# Patient Record
Sex: Female | Born: 1990 | Race: Black or African American | Hispanic: No | Marital: Single | Smoking: Never smoker
Health system: Southern US, Community
[De-identification: ages and names within clinical notes are randomized; demographics above are authoritative.]

## PROBLEM LIST (undated history)

## (undated) DIAGNOSIS — K5190 Ulcerative colitis, unspecified, without complications: Secondary | ICD-10-CM

## (undated) HISTORY — DX: Ulcerative colitis, unspecified, without complications: K51.90

---

## 2018-05-12 ENCOUNTER — Encounter (HOSPITAL_COMMUNITY): Payer: Self-pay

## 2018-05-12 ENCOUNTER — Other Ambulatory Visit: Payer: Self-pay

## 2018-05-12 ENCOUNTER — Emergency Department (HOSPITAL_COMMUNITY): Payer: Managed Care, Other (non HMO)

## 2018-05-12 ENCOUNTER — Inpatient Hospital Stay (HOSPITAL_COMMUNITY)
Admission: EM | Admit: 2018-05-12 | Discharge: 2018-05-19 | DRG: 386 | Disposition: A | Discharge status: Home / Self Care | Payer: Managed Care, Other (non HMO) | Attending: Internal Medicine | Admitting: Internal Medicine

## 2018-05-12 DIAGNOSIS — D62 Acute posthemorrhagic anemia: Secondary | ICD-10-CM | POA: Diagnosis present

## 2018-05-12 DIAGNOSIS — K5190 Ulcerative colitis, unspecified, without complications: Secondary | ICD-10-CM | POA: Diagnosis present

## 2018-05-12 DIAGNOSIS — E876 Hypokalemia: Secondary | ICD-10-CM | POA: Diagnosis not present

## 2018-05-12 DIAGNOSIS — D649 Anemia, unspecified: Secondary | ICD-10-CM

## 2018-05-12 DIAGNOSIS — R000 Tachycardia, unspecified: Secondary | ICD-10-CM | POA: Diagnosis present

## 2018-05-12 DIAGNOSIS — D509 Iron deficiency anemia, unspecified: Secondary | ICD-10-CM | POA: Diagnosis present

## 2018-05-12 DIAGNOSIS — K51911 Ulcerative colitis, unspecified with rectal bleeding: Principal | ICD-10-CM | POA: Diagnosis present

## 2018-05-12 DIAGNOSIS — E871 Hypo-osmolality and hyponatremia: Secondary | ICD-10-CM | POA: Diagnosis present

## 2018-05-12 DIAGNOSIS — K529 Noninfective gastroenteritis and colitis, unspecified: Secondary | ICD-10-CM | POA: Diagnosis not present

## 2018-05-12 DIAGNOSIS — Z79899 Other long term (current) drug therapy: Secondary | ICD-10-CM

## 2018-05-12 DIAGNOSIS — K639 Disease of intestine, unspecified: Secondary | ICD-10-CM

## 2018-05-12 DIAGNOSIS — E860 Dehydration: Secondary | ICD-10-CM | POA: Diagnosis present

## 2018-05-12 DIAGNOSIS — R197 Diarrhea, unspecified: Secondary | ICD-10-CM | POA: Diagnosis present

## 2018-05-12 LAB — C DIFFICILE QUICK SCREEN W PCR REFLEX
C Diff antigen: NEGATIVE
C Diff interpretation: NOT DETECTED
C Diff toxin: NEGATIVE

## 2018-05-12 LAB — URINALYSIS, ROUTINE W REFLEX MICROSCOPIC
Bilirubin Urine: NEGATIVE
Glucose, UA: NEGATIVE mg/dL
Ketones, ur: 20 mg/dL — AB
Leukocytes, UA: NEGATIVE
Nitrite: NEGATIVE
Protein, ur: NEGATIVE mg/dL
Specific Gravity, Urine: 1.004 — ABNORMAL LOW (ref 1.005–1.030)
pH: 7 (ref 5.0–8.0)

## 2018-05-12 LAB — COMPREHENSIVE METABOLIC PANEL
ALT: 8 U/L (ref 0–44)
AST: 13 U/L — AB (ref 15–41)
Albumin: 2.4 g/dL — ABNORMAL LOW (ref 3.5–5.0)
Alkaline Phosphatase: 56 U/L (ref 38–126)
Anion gap: 13 (ref 5–15)
BUN: 9 mg/dL (ref 6–20)
CO2: 29 mmol/L (ref 22–32)
Calcium: 7.9 mg/dL — ABNORMAL LOW (ref 8.9–10.3)
Chloride: 83 mmol/L — ABNORMAL LOW (ref 98–111)
Creatinine, Ser: 0.7 mg/dL (ref 0.44–1.00)
GFR calc Af Amer: >60 mL/min (ref >60)
Glucose, Bld: 149 mg/dL — ABNORMAL HIGH (ref 70–99)
Potassium: 2.4 mmol/L — CL (ref 3.5–5.1)
Sodium: 125 mmol/L — ABNORMAL LOW (ref 135–145)
Total Bilirubin: 0.8 mg/dL (ref 0.3–1.2)
Total Protein: 6.4 g/dL — ABNORMAL LOW (ref 6.5–8.1)

## 2018-05-12 LAB — CBC WITH DIFFERENTIAL/PLATELET
Abs Immature Granulocytes: 1.2 10*3/uL — ABNORMAL HIGH (ref 0.00–0.07)
Basophils Absolute: 0.1 10*3/uL (ref 0.0–0.1)
Basophils Relative: 0 %
Eosinophils Absolute: 0 10*3/uL (ref 0.0–0.5)
Eosinophils Relative: 0 %
HCT: 20.9 % — ABNORMAL LOW (ref 36.0–46.0)
Hemoglobin: 6.2 g/dL — CL (ref 12.0–15.0)
Immature Granulocytes: 6 %
Lymphocytes Relative: 8 %
Lymphs Abs: 1.7 10*3/uL (ref 0.7–4.0)
MCH: 20.8 pg — ABNORMAL LOW (ref 26.0–34.0)
MCHC: 29.7 g/dL — ABNORMAL LOW (ref 30.0–36.0)
MCV: 70.1 fL — ABNORMAL LOW (ref 80.0–100.0)
Monocytes Absolute: 2.3 10*3/uL — ABNORMAL HIGH (ref 0.1–1.0)
Monocytes Relative: 11 %
Neutro Abs: 16.2 10*3/uL — ABNORMAL HIGH (ref 1.7–7.7)
Neutrophils Relative %: 75 %
Platelets: 469 10*3/uL — ABNORMAL HIGH (ref 150–400)
RBC: 2.98 MIL/uL — ABNORMAL LOW (ref 3.87–5.11)
RDW: 16.9 % — ABNORMAL HIGH (ref 11.5–15.5)
WBC: 21.5 10*3/uL — ABNORMAL HIGH (ref 4.0–10.5)
nRBC: 0.5 % — ABNORMAL HIGH (ref 0.0–0.2)

## 2018-05-12 LAB — MAGNESIUM: Magnesium: 1.8 mg/dL (ref 1.7–2.4)

## 2018-05-12 LAB — IRON AND TIBC
Iron: 17 ug/dL — ABNORMAL LOW (ref 28–170)
Saturation Ratios: 8 % — ABNORMAL LOW (ref 10.4–31.8)
TIBC: 209 ug/dL — ABNORMAL LOW (ref 250–450)
UIBC: 192 ug/dL

## 2018-05-12 LAB — RETICULOCYTES
Immature Retic Fract: 39 % — ABNORMAL HIGH (ref 2.3–15.9)
RBC.: 3 MIL/uL — ABNORMAL LOW (ref 3.87–5.11)
Retic Count, Absolute: 74.1 10*3/uL (ref 19.0–186.0)
Retic Ct Pct: 2.5 % (ref 0.4–3.1)

## 2018-05-12 LAB — LIPASE, BLOOD: Lipase: 80 U/L — ABNORMAL HIGH (ref 11–51)

## 2018-05-12 LAB — VITAMIN B12: Vitamin B-12: 815 pg/mL (ref 180–914)

## 2018-05-12 LAB — I-STAT BETA HCG BLOOD, ED (MC, WL, AP ONLY)

## 2018-05-12 LAB — FERRITIN: Ferritin: 42 ng/mL (ref 11–307)

## 2018-05-12 LAB — FOLATE: Folate: 7.5 ng/mL (ref >5.9)

## 2018-05-12 LAB — PREPARE RBC (CROSSMATCH)

## 2018-05-12 LAB — POC OCCULT BLOOD, ED: Fecal Occult Bld: POSITIVE — AB

## 2018-05-12 MED ORDER — MAGNESIUM SULFATE 50 % IJ SOLN: 1.0000 g | Freq: Once | INTRAMUSCULAR | Status: DC

## 2018-05-12 MED ORDER — ONDANSETRON HCL 4 MG/2ML IJ SOLN: 4.0000 mg | Freq: Four times a day (QID) | INTRAMUSCULAR | Status: DC | PRN

## 2018-05-12 MED ORDER — IOPAMIDOL (ISOVUE-300) INJECTION 61%
INTRAVENOUS | Status: AC
  Filled 2018-05-12: qty 100

## 2018-05-12 MED ORDER — POTASSIUM CHLORIDE CRYS ER 20 MEQ PO TBCR
40.0000 meq | EXTENDED_RELEASE_TABLET | Freq: Once | ORAL | Status: AC
  Administered 2018-05-12: 40 meq via ORAL
  Filled 2018-05-12: qty 2

## 2018-05-12 MED ORDER — ONDANSETRON HCL 4 MG PO TABS: 4.0000 mg | ORAL_TABLET | Freq: Four times a day (QID) | ORAL | Status: DC | PRN

## 2018-05-12 MED ORDER — SODIUM CHLORIDE 0.9 % IV SOLN
10.0000 mL/h | Freq: Once | INTRAVENOUS | Status: AC
  Administered 2018-05-12 (×2): 10 mL/h via INTRAVENOUS

## 2018-05-12 MED ORDER — ACETAMINOPHEN 325 MG PO TABS
650.0000 mg | ORAL_TABLET | Freq: Four times a day (QID) | ORAL | Status: DC | PRN
  Administered 2018-05-13: 650 mg via ORAL
  Filled 2018-05-12: qty 2

## 2018-05-12 MED ORDER — SODIUM CHLORIDE (PF) 0.9 % IJ SOLN
INTRAMUSCULAR | Status: AC
  Filled 2018-05-12: qty 50

## 2018-05-12 MED ORDER — SODIUM CHLORIDE 0.9 % IV BOLUS
1000.0000 mL | Freq: Once | INTRAVENOUS | Status: AC
  Administered 2018-05-12: 1000 mL via INTRAVENOUS

## 2018-05-12 MED ORDER — ACETAMINOPHEN 650 MG RE SUPP: 650.0000 mg | Freq: Four times a day (QID) | RECTAL | Status: DC | PRN

## 2018-05-12 MED ORDER — MAGNESIUM SULFATE IN D5W 1-5 GM/100ML-% IV SOLN
1.0000 g | Freq: Once | INTRAVENOUS | Status: AC
  Administered 2018-05-12: 1 g via INTRAVENOUS
  Filled 2018-05-12: qty 100

## 2018-05-12 MED ORDER — POTASSIUM CHLORIDE 10 MEQ/100ML IV SOLN
10.0000 meq | INTRAVENOUS | Status: AC
  Administered 2018-05-12 (×3): 10 meq via INTRAVENOUS
  Filled 2018-05-12 (×5): qty 100

## 2018-05-12 MED ORDER — IOPAMIDOL (ISOVUE-300) INJECTION 61%
100.0000 mL | Freq: Once | INTRAVENOUS | Status: AC | PRN
  Administered 2018-05-12: 100 mL via INTRAVENOUS

## 2018-05-12 NOTE — ED Notes (Signed)
EDPA Provider at bedside. 

## 2018-05-12 NOTE — ED Provider Notes (Signed)
[facility]-EMERGENCY DEPT Provider Note   CSN: [id number] Arrival date & time: 05/12/18  1720     History   Chief Complaint Chief Complaint  Patient presents with  . Abnormal Lab  . Abdominal Pain  . Diarrhea    HPI Joann Robinson is a 27 y.o. female with no significant past medical history presents for evaluation of acute onset, progressively worsening nausea, vomiting, diarrhea, and generalized weakness for 3 weeks.  Reports that she has had upwards of 10 episodes of watery nonbloody diarrhea daily and 2-3 episodes of nonbloody nonbilious emesis daily.  Has been seen by her PCP twice, prescribed Pepcid and Phenergan with improvement in nausea and vomiting but no improvement in diarrhea. Has also tried and She also reports intermittent crampy lower abdominal pain which typically occurs prior to bowel movements.  She had lab work drawn by her PCP 3 days ago which showed hypokalemia, hyponatremia, and marked leukocytosis.  Her PCP recommended presentation to the ED for further evaluation and imaging.  Patient reports no aggravating or alleviating factors, pain does not radiate.  She denies chest pain or shortness of breath, no urinary symptoms.  Denies recent treatment with antibiotics, recent travel out of the country, or suspicious food intake.  Her sister is concerned that she has been eating ice from an ice machine at work which she feels could have exposure to bacteria.  The history is provided by the patient.    History reviewed. No pertinent past medical history.  Patient Active Problem List   Diagnosis Date Noted  . Symptomatic anemia 05/12/2018  . Colitis 05/12/2018  . Diarrhea 05/12/2018  . Hypokalemia 05/12/2018    History reviewed. No pertinent surgical history.   OB History   None      Home Medications    Prior to Admission medications   Medication Sig Start Date End Date Taking? Authorizing Provider  Charcoal Activated (ACTIVATED CHARCOAL  PO) Take 1,120 mg by mouth 2 (two) times daily. Activated Charcoal 560 mg Capsules   Yes [provider]  famotidine (PEPCID) 20 MG tablet Take 20 mg by mouth 2 (two) times daily. 05/01/18  Yes [provider]  OVER THE COUNTER MEDICATION Take 30 sprays by mouth 2 (two) times daily. Sovereign Silver 50 mcg Spray - 30 sprays under the tongue and hold for 30 seconds then swallow   Yes [provider]  promethazine (PHENERGAN) 25 MG tablet Take 25 mg by mouth every 8 (eight) hours as needed for nausea or vomiting.   Yes [provider]    Family History History reviewed. No pertinent family history.  Social History Social History   Tobacco Use  . Smoking status: Never Smoker  . Smokeless tobacco: Never Used  Substance Use Topics  . Alcohol use: Never    Frequency: Never  . Drug use: Never     Allergies   Patient has no known allergies.   Review of Systems Review of Systems  Constitutional: Positive for fatigue. Negative for chills and fever.  Respiratory: Negative for shortness of breath.   Cardiovascular: Negative for chest pain.  Gastrointestinal: Positive for abdominal pain, diarrhea, nausea and vomiting.  Neurological: Positive for light-headedness. Negative for syncope.  All other systems reviewed and are negative.    Physical Exam Updated Vital Signs BP (!) 140/91   Pulse (!) 124   Temp 98.7 F (37.1 C) (Oral)   Resp (!) 23   Ht 5\' 7"  (1.702 m)   Wt 90.3  kg   LMP 05/11/2018   SpO2 100%   BMI 31.17 kg/m   Physical Exam  Constitutional: She appears well-developed and well-nourished. No distress.  HENT:  Head: Normocephalic and atraumatic.  Eyes: Conjunctivae are normal. Right eye exhibits no discharge. Left eye exhibits no discharge.  Neck: No JVD present. No tracheal deviation present.  Cardiovascular: Regular rhythm.  tachycardic  Pulmonary/Chest: Effort normal and breath sounds normal.  Abdominal: Soft. She exhibits  no distension. Bowel sounds are decreased. There is tenderness in the right upper quadrant, right lower quadrant, periumbilical area and suprapubic area. There is no rigidity, no rebound, no guarding, no CVA tenderness, no tenderness at McBurney's point and negative Murphy's sign.  Musculoskeletal: She exhibits no edema.  No midline spine TTP, no paraspinal muscle tenderness, no deformity, crepitus, or step-off noted   Neurological: She is alert.  Skin: Skin is warm and dry. No erythema.  Psychiatric: She has a normal mood and affect. Her behavior is normal.  Nursing note and vitals reviewed.    ED Treatments / Results  Labs (all labs ordered are listed, but only abnormal results are displayed) Labs Reviewed  LIPASE, BLOOD - Abnormal; Notable for the following components:      Result Value   Lipase 80 (*)    All other components within normal limits  COMPREHENSIVE METABOLIC PANEL - Abnormal; Notable for the following components:   Sodium 125 (*)    Potassium 2.4 (*)    Chloride 83 (*)    Glucose, Bld 149 (*)    Calcium 7.9 (*)    Total Protein 6.4 (*)    Albumin 2.4 (*)    AST 13 (*)    All other components within normal limits  URINALYSIS, ROUTINE W REFLEX MICROSCOPIC - Abnormal; Notable for the following components:   Color, Urine STRAW (*)    Specific Gravity, Urine 1.004 (*)    Hgb urine dipstick MODERATE (*)    Ketones, ur 20 (*)    Bacteria, UA RARE (*)    All other components within normal limits  CBC WITH DIFFERENTIAL/PLATELET - Abnormal; Notable for the following components:   WBC 21.5 (*)    RBC 2.98 (*)    Hemoglobin 6.2 (*)    HCT 20.9 (*)    MCV 70.1 (*)    MCH 20.8 (*)    MCHC 29.7 (*)    RDW 16.9 (*)    Platelets 469 (*)    nRBC 0.5 (*)    Neutro Abs 16.2 (*)    Monocytes Absolute 2.3 (*)    Abs Immature Granulocytes 1.20 (*)    All other components within normal limits  IRON AND TIBC - Abnormal; Notable for the following components:   Iron 17 (*)      TIBC 209 (*)    Saturation Ratios 8 (*)    All other components within normal limits  RETICULOCYTES - Abnormal; Notable for the following components:   RBC. 3.00 (*)    Immature Retic Fract 39.0 (*)    All other components within normal limits  POC OCCULT BLOOD, ED - Abnormal; Notable for the following components:   Fecal Occult Bld POSITIVE (*)    All other components within normal limits  C DIFFICILE QUICK SCREEN W PCR REFLEX  GASTROINTESTINAL PANEL BY PCR, STOOL (REPLACES STOOL CULTURE)  MAGNESIUM  VITAMIN B12  FOLATE  FERRITIN  HIV ANTIBODY (ROUTINE TESTING W REFLEX)  BASIC METABOLIC PANEL  MAGNESIUM  HEPATIC FUNCTION PANEL  CBC  WITH DIFFERENTIAL/PLATELET  LACTIC ACID, PLASMA  I-STAT BETA HCG BLOOD, ED ([facility], [facility], [facility] ONLY)  TYPE AND SCREEN  PREPARE RBC (CROSSMATCH)  ABO/RH    EKG None  Radiology Ct Abdomen Pelvis W Contrast  Result Date: 05/12/2018 CLINICAL DATA:  Nausea, vomiting.  Generalized abdominal pain. EXAM: CT ABDOMEN AND PELVIS WITH CONTRAST TECHNIQUE: Multidetector CT imaging of the abdomen and pelvis was performed using the standard protocol following bolus administration of intravenous contrast. CONTRAST:  ISOVUE-300 IOPAMIDOL (ISOVUE-300) INJECTION 61% COMPARISON:  None. FINDINGS: Lower chest: Lung bases are clear. No effusions. Heart is normal size. Hepatobiliary: Diffuse low-density throughout the liver compatible with fatty infiltration. No focal abnormality. Gallbladder unremarkable. Pancreas: No focal abnormality or ductal dilatation. Spleen: No focal abnormality.  Normal size. Adrenals/Urinary Tract: No adrenal abnormality. No focal renal abnormality. No stones or hydronephrosis. Urinary bladder is unremarkable. Stomach/Bowel: There is colonic wall thickening throughout much of the colon compatible with diffuse colitis, most pronounced in the descending colon and rectosigmoid colon. No evidence of bowel obstruction. Stomach and small bowel  decompressed, unremarkable. Vascular/Lymphatic: No evidence of aneurysm or adenopathy. There are prominent mesenteric lymph nodes noted adjacent to the rectosigmoid colon, likely reactive. Reproductive: Uterus and adnexa unremarkable.  No mass. Other: No free fluid or free air. Musculoskeletal: No acute bony abnormality. IMPRESSION: Rather diffuse colonic wall thickening, most pronounced in the distal descending colon and rectosigmoid colon compatible with infectious or inflammatory colitis. Fatty infiltration of the liver. Electronically Signed   By: Charlett Nose M.D.   On: 05/12/2018 22:33    Procedures .Critical Care Performed by: Jeanie Sewer, PA-C Authorized by: Jeanie Sewer, PA-C   Critical care provider statement:    Critical care time (minutes):  45   Critical care was necessary to treat or prevent imminent or life-threatening deterioration of the following conditions:  Circulatory failure and metabolic crisis   Critical care was time spent personally by me on the following activities:  Discussions with consultants, evaluation of patient's response to treatment, examination of patient, ordering and performing treatments and interventions, ordering and review of laboratory studies, ordering and review of radiographic studies, pulse oximetry, re-evaluation of patient's condition, obtaining history from patient or surrogate and review of old charts   I assumed direction of critical care for this patient from another provider in my specialty: no     (including critical care time)  Medications Ordered in ED Medications  potassium chloride 10 mEq in 100 mL IVPB (10 mEq Intravenous New Bag/Given 05/12/18 2333)  iopamidol (ISOVUE-300) 61 % injection (has no administration in time range)  sodium chloride (PF) 0.9 % injection (has no administration in time range)  acetaminophen (TYLENOL) tablet 650 mg (has no administration in time range)    Or  acetaminophen (TYLENOL) suppository 650 mg (has  no administration in time range)  ondansetron (ZOFRAN) tablet 4 mg (has no administration in time range)    Or  ondansetron (ZOFRAN) injection 4 mg (has no administration in time range)  sodium chloride 0.9 % bolus 1,000 mL (0 mLs Intravenous Stopped 05/12/18 2006)  potassium chloride SA (K-DUR,KLOR-CON) CR tablet 40 mEq (40 mEq Oral Given 05/12/18 2023)  magnesium sulfate IVPB 1 g 100 mL (0 g Intravenous Stopped 05/12/18 2155)  0.9 %  sodium chloride infusion (10 mL/hr Intravenous New Bag/Given 05/12/18 2241)  iopamidol (ISOVUE-300) 61 % injection 100 mL (100 mLs Intravenous Contrast Given 05/12/18 2202)     Initial Impression / Assessment and Plan / ED  Course  I have reviewed the triage vital signs and the nursing notes.  Pertinent labs & imaging results that were available during my care of the patient were reviewed by me and considered in my medical decision making (see chart for details).     Patient presents sent from PCP for evaluation of abnormal lab work and nausea vomiting and diarrhea for 3-1/2 weeks.  Patient is afebrile, persistently tachycardic and tachypneic in the ED.  She appears pale and generally weak.  Lab work significant for hemoglobin of 6.2.  This is a 3 g drop compared to lab work performed 3 days ago.  She does report that she has heavy periods and recently completed her menstrual cycle yesterday.  Her Hemoccult is also positive which I suspect is related to epithelial injury secondary to persistent diarrhea for 3 weeks.  Other lab work reviewed by me significant for mildly elevated lipase of 80, hyponatremia with a sodium of 125, hypochloremia with chloride 83, and hypokalemia with a potassium of 2.4.  She was given IV fluids and 2 units of packed red blood cells in the ER.  UA equivocal for UTI.  CT of the abdomen pelvis shows diffuse colonic wall thickening compatible with infectious or inflammatory colitis.  Spoke with Dr. Toniann Fail with [facility] hospitalist service who  agrees to assume care of patient and bring her into the hospital for further evaluation and management.   Final Clinical Impressions(s) / ED Diagnoses   Final diagnoses:  Symptomatic anemia  Colon wall thickening  Hyponatremia    ED Discharge Orders    None       Bennye Alm 05/13/18 0011    Loren Racer, MD 05/15/18 1517

## 2018-05-12 NOTE — ED Triage Notes (Signed)
[facility]. Patient has been experiencing n/v and diarrhea and generalized abd pain x3 weeks.  Patient states primary care provider wanted to do DG of abdomen.  NA-127 K-2.9 HGB-9.6 CL-86 Alb-3.2 CA-8.3 Sedimentation rate-46  Paper work with patient.   A/Ox4 Ambulatory in triage.  2/10 pain level

## 2018-05-12 NOTE — ED Notes (Signed)
BCS AT BEDSIDE. INSTRUCTED ON URINE AND STOOL COLLECTION

## 2018-05-12 NOTE — ED Notes (Signed)
Date and time results received: 05/12/18 "7:13 PM(use smartphrase ".now" to insert current time)  Test: K+ Critical Value: 2.4  Name of Provider Notified: Mina PA-C Orders Received? Or Actions Taken?: will follow up accordingly

## 2018-05-12 NOTE — ED Notes (Signed)
ED TO INPATIENT HANDOFF REPORT  Name/Age/Gender Joann Robinson 27 y.o. female  Code Status    Code Status Orders  (From admission, onward)         Start     Ordered   05/12/18 2341  Full code  Continuous     05/12/18 2341        Code Status History    This patient has a current code status but no historical code status.      Home/SNF/Other Home  Chief Complaint Abnormal Labs  Level of Care/Admitting Diagnosis ED Disposition    ED Disposition Condition Comment   Admit  Hospital Area: [facility] [[id number]]  Level of Care: Telemetry [5]  Admit to tele based on following criteria: Monitor QTC interval  Diagnosis: Symptomatic anemia [1610960]  Admitting Physician: Eduard Clos [phone]  Attending Physician: Eduard Clos Florian.Pax  PT Class (Do Not Modify): Observation [104]  PT Acc Code (Do Not Modify): Observation [10022]       Medical History History reviewed. No pertinent past medical history.  Allergies No Known Allergies  IV Location/Drains/Wounds Patient Lines/Drains/Airways Status   Active Line/Drains/Airways    Name:   Placement date:   Placement time:   Site:   Days:   Peripheral IV 05/12/18 Right Antecubital   05/12/18    1822    Antecubital   less than 1   Peripheral IV 05/12/18 Left Hand   05/12/18    2007    Hand   less than 1          Labs/Imaging Results for orders placed or performed during the hospital encounter of 05/12/18 (from the past 48 hour(s))  Lipase, blood     Status: Abnormal   Collection Time: 05/12/18  6:11 PM  Result Value Ref Range   Lipase 80 (H) 11 - 51 U/L    Comment: Performed at [facility], [address] [place], [place] [address]  Comprehensive metabolic panel     Status: Abnormal   Collection Time: 05/12/18  6:11 PM  Result Value Ref Range   Sodium 125 (L) 135 - 145 mmol/L   Potassium 2.4 (LL) 3.5 - 5.1 mmol/L    Comment: CRITICAL RESULT CALLED TO, READ BACK BY  AND VERIFIED WITH: COLES,L RN [id number] COVINGTON,N    Chloride 83 (L) 98 - 111 mmol/L   CO2 29 22 - 32 mmol/L   Glucose, Bld 149 (H) 70 - 99 mg/dL   BUN 9 6 - 20 mg/dL   Creatinine, Ser 1.47 0.44 - 1.00 mg/dL   Calcium 7.9 (L) 8.9 - 10.3 mg/dL   Total Protein 6.4 (L) 6.5 - 8.1 g/dL   Albumin 2.4 (L) 3.5 - 5.0 g/dL   AST 13 (L) 15 - 41 U/L   ALT 8 0 - 44 U/L   Alkaline Phosphatase 56 38 - 126 U/L   Total Bilirubin 0.8 0.3 - 1.2 mg/dL   GFR calc non Af Amer >60 >60 mL/min   GFR calc Af Amer >60 >60 mL/min   Anion gap 13 5 - 15    Comment: Performed at [facility], [address], [place], [place] [address]  CBC with Differential     Status: Abnormal   Collection Time: 05/12/18  6:12 PM  Result Value Ref Range   WBC 21.5 (H) 4.0 - 10.5 K/uL   RBC 2.98 (L) 3.87 - 5.11 MIL/uL   Hemoglobin 6.2 (LL) 12.0 - 15.0  g/dL    Comment: Reticulocyte Hemoglobin testing may be clinically indicated, consider ordering this additional test [id number] THIS CRITICAL RESULT HAS VERIFIED AND BEEN CALLED TO B.JESSEE BY VINCENT WILKINS ON 12 02 2019 AT 1857, AND HAS BEEN READ BACK. CRITICAL RESULT VERIFIED    HCT 20.9 (L) 36.0 - 46.0 %   MCV 70.1 (L) 80.0 - 100.0 fL   MCH 20.8 (L) 26.0 - 34.0 pg   MCHC 29.7 (L) 30.0 - 36.0 g/dL   RDW 54.0 (H) 98.1 - 19.1 %   Platelets 469 (H) 150 - 400 K/uL   nRBC 0.5 (H) 0.0 - 0.2 %   Neutrophils Relative % 75 %   Neutro Abs 16.2 (H) 1.7 - 7.7 K/uL   Lymphocytes Relative 8 %   Lymphs Abs 1.7 0.7 - 4.0 K/uL   Monocytes Relative 11 %   Monocytes Absolute 2.3 (H) 0.1 - 1.0 K/uL   Eosinophils Relative 0 %   Eosinophils Absolute 0.0 0.0 - 0.5 K/uL   Basophils Relative 0 %   Basophils Absolute 0.1 0.0 - 0.1 K/uL   Immature Granulocytes 6 %   Abs Immature Granulocytes 1.20 (H) 0.00 - 0.07 K/uL    Comment: Performed at [facility], [address] [place], [place] [address]  Magnesium     Status: None   Collection Time:  05/12/18  6:12 PM  Result Value Ref Range   Magnesium 1.8 1.7 - 2.4 mg/dL    Comment: Performed at [facility], [address], [place], [place] [address]  I-Stat beta hCG blood, ED     Status: None   Collection Time: 05/12/18  6:25 PM  Result Value Ref Range   I-stat hCG, quantitative <5.0 <5 mIU/mL   Comment 3            Comment:   GEST. AGE      CONC.  (mIU/mL)   <=1 WEEK        5 - 50     2 WEEKS       50 - 500     3 WEEKS       100 - 10,000     4 WEEKS     1,000 - 30,000        FEMALE AND NON-PREGNANT FEMALE:     LESS THAN 5 mIU/mL   Urinalysis, Routine w reflex microscopic     Status: Abnormal   Collection Time: 05/12/18  7:33 PM  Result Value Ref Range   Color, Urine STRAW (A) YELLOW   APPearance CLEAR CLEAR   Specific Gravity, Urine 1.004 (L) 1.005 - 1.030   pH 7.0 5.0 - 8.0   Glucose, UA NEGATIVE NEGATIVE mg/dL   Hgb urine dipstick MODERATE (A) NEGATIVE   Bilirubin Urine NEGATIVE NEGATIVE   Ketones, ur 20 (A) NEGATIVE mg/dL   Protein, ur NEGATIVE NEGATIVE mg/dL   Nitrite NEGATIVE NEGATIVE   Leukocytes, UA NEGATIVE NEGATIVE   RBC / HPF 0-5 0 - 5 RBC/hpf   WBC, UA 11-20 0 - 5 WBC/hpf   Bacteria, UA RARE (A) NONE SEEN   Squamous Epithelial / LPF 0-5 0 - 5    Comment: Performed at [facility], [address] [place], [place] [address]  C difficile quick scan w PCR reflex     Status: None   Collection Time: 05/12/18  7:33 PM  Result Value Ref Range   C Diff antigen NEGATIVE NEGATIVE   C Diff toxin  NEGATIVE NEGATIVE   C Diff interpretation No C. difficile detected.     Comment: Performed at [facility], [address], [place], [place] [address]  POC occult blood, ED RN will collect     Status: Abnormal   Collection Time: 05/12/18  7:40 PM  Result Value Ref Range   Fecal Occult Bld POSITIVE (A) NEGATIVE  ABO/Rh     Status: None (Preliminary result)   Collection Time: 05/12/18  8:16 PM  Result Value Ref  Range   ABO/RH(D)      O POS Performed at [facility], [address], [place], [place] [address]   Type and screen [facility]     Status: None (Preliminary result)   Collection Time: 05/12/18  8:17 PM  Result Value Ref Range   ABO/RH(D) O POS    Antibody Screen NEG    Sample Expiration 05/15/2018    Unit Number [id number]    Blood Component Type RED CELLS,LR    Unit division 00    Status of Unit ISSUED    Transfusion Status OK TO TRANSFUSE    Crossmatch Result      Compatible Performed at [facility], [address], [place], [place] [address]    Unit Number [id number]    Blood Component Type RED CELLS,LR    Unit division 00    Status of Unit ALLOCATED    Transfusion Status OK TO TRANSFUSE    Crossmatch Result Compatible   Vitamin B12     Status: None   Collection Time: 05/12/18  8:17 PM  Result Value Ref Range   Vitamin B-12 815 180 - 914 pg/mL    Comment: (NOTE) This assay is not validated for testing neonatal or myeloproliferative syndrome specimens for Vitamin B12 levels. Performed at [facility], [address] [place], [place] [address]   Folate     Status: None   Collection Time: 05/12/18  8:17 PM  Result Value Ref Range   Folate 7.5 >5.9 ng/mL    Comment: Performed at [facility], [address], [place], [place] [address]  Iron and TIBC     Status: Abnormal   Collection Time: 05/12/18  8:17 PM  Result Value Ref Range   Iron 17 (L) 28 - 170 ug/dL   TIBC 366 (L) 440 - 347 ug/dL   Saturation Ratios 8 (L) 10.4 - 31.8 %   UIBC 192 ug/dL    Comment: Performed at [facility], [address] [place], [place] [address]  Ferritin     Status: None   Collection Time: 05/12/18  8:17 PM  Result Value Ref Range   Ferritin 42 11 - 307 ng/mL    Comment: Performed at [facility], [address], [place], [place] [address]   Prepare RBC     Status: None   Collection Time: 05/12/18  8:30 PM  Result Value Ref Range   Order Confirmation      ORDER PROCESSED BY BLOOD BANK Performed at [facility], [address] [place], [place] [address]   Reticulocytes     Status: Abnormal   Collection Time: 05/12/18  9:21 PM  Result Value Ref Range   Retic Ct Pct 2.5 0.4 - 3.1 %   RBC. 3.00 (L) 3.87 - 5.11 MIL/uL   Retic Count, Absolute 74.1 19.0 - 186.0 K/uL   Immature Retic Fract 39.0 (H) 2.3 - 15.9 %  Comment: Performed at [facility], [address] [place], [place] [id number]   Ct Abdomen Pelvis W Contrast  Result Date: 05/12/2018 CLINICAL DATA:  Nausea, vomiting.  Generalized abdominal pain. EXAM: CT ABDOMEN AND PELVIS WITH CONTRAST TECHNIQUE: Multidetector CT imaging of the abdomen and pelvis was performed using the standard protocol following bolus administration of intravenous contrast. CONTRAST:  100mL ISOVUE-300 IOPAMIDOL (ISOVUE-300) INJECTION 61% COMPARISON:  None. FINDINGS: Lower chest: Lung bases are clear. No effusions. Heart is normal size. Hepatobiliary: Diffuse low-density throughout the liver compatible with fatty infiltration. No focal abnormality. Gallbladder unremarkable. Pancreas: No focal abnormality or ductal dilatation. Spleen: No focal abnormality.  Normal size. Adrenals/Urinary Tract: No adrenal abnormality. No focal renal abnormality. No stones or hydronephrosis. Urinary bladder is unremarkable. Stomach/Bowel: There is colonic wall thickening throughout much of the colon compatible with diffuse colitis, most pronounced in the descending colon and rectosigmoid colon. No evidence of bowel obstruction. Stomach and small bowel decompressed, unremarkable. Vascular/Lymphatic: No evidence of aneurysm or adenopathy. There are prominent mesenteric lymph nodes noted adjacent to the rectosigmoid colon, likely reactive. Reproductive: Uterus and adnexa unremarkable.  No mass.  Other: No free fluid or free air. Musculoskeletal: No acute bony abnormality. IMPRESSION: Rather diffuse colonic wall thickening, most pronounced in the distal descending colon and rectosigmoid colon compatible with infectious or inflammatory colitis. Fatty infiltration of the liver. Electronically Signed   By: Charlett NoseKevin  Dover M.D.   On: 05/12/2018 22:33   None  Pending Labs Unresulted Labs (From admission, onward)    Start     Ordered   05/13/18 0500  HIV antibody (Routine Testing)  Tomorrow morning,   R     05/12/18 2341   05/13/18 0500  Basic metabolic panel  Tomorrow morning,   R     05/12/18 2341   05/13/18 0500  Magnesium  Tomorrow morning,   R     05/12/18 2341   05/13/18 0500  Hepatic function panel  Tomorrow morning,   R     05/12/18 2341   05/13/18 0500  CBC WITH DIFFERENTIAL  Tomorrow morning,   R     05/12/18 2341   05/13/18 0500  Lactic acid, plasma  Tomorrow morning,   R     05/12/18 2341   05/12/18 1804  Gastrointestinal Panel by PCR , Stool  (Gastrointestinal Panel by PCR, Stool)  Once,   R     05/12/18 1803          Vitals/Pain Today's Vitals   05/12/18 2133 05/12/18 2221 05/12/18 2300 05/12/18 2301  BP: (!) 138/97 (!) 145/91 (!) 140/91 (!) 140/91  Pulse: (!) 123 (!) 119 (!) 124 (!) 124  Resp: (!) 24 (!) 24 (!) 23 (!) 23  Temp:  99.7 F (37.6 C) 98.7 F (37.1 C) 98.7 F (37.1 C)  TempSrc:  Oral Oral Oral  SpO2: 100% 100% 100% 100%  Weight:      Height:      PainSc:        Isolation Precautions Enteric precautions (UV disinfection)  Medications Medications  potassium chloride 10 mEq in 100 mL IVPB (10 mEq Intravenous New Bag/Given 05/12/18 2333)  iopamidol (ISOVUE-300) 61 % injection (has no administration in time range)  sodium chloride (PF) 0.9 % injection (has no administration in time range)  acetaminophen (TYLENOL) tablet 650 mg (has no administration in time range)    Or  acetaminophen (TYLENOL) suppository 650 mg (has no administration in time  range)  ondansetron (ZOFRAN) tablet  4 mg (has no administration in time range)    Or  ondansetron [facility]) injection 4 mg (has no administration in time range)  sodium chloride 0.9 % bolus 1,000 mL (0 mLs Intravenous Stopped 05/12/18 2006)  potassium chloride SA (K-DUR,KLOR-CON) CR tablet 40 mEq (40 mEq Oral Given 05/12/18 2023)  magnesium sulfate IVPB 1 g 100 mL (0 g Intravenous Stopped 05/12/18 2155)  0.9 %  sodium chloride infusion (10 mL/hr Intravenous New Bag/Given 05/12/18 2241)  iopamidol (ISOVUE-300) 61 % injection 100 mL (100 mLs Intravenous Contrast Given 05/12/18 2202)    Mobility walks

## 2018-05-12 NOTE — ED Notes (Signed)
Date and time results received: 05/12/18 1858 (use smartphrase ".now" to insert current time)  Test: hgb Critical Value: 6.2  Name of Provider Notified: Michela PitcherMina Fawze  Orders Received? Or Actions Taken?: Actions Taken: notified Michela PitcherMina Fawze of hgb

## 2018-05-12 NOTE — H&P (Signed)
History and Physical    Joann Robinson ONG:[id number] DOB: January 31, 1991 DOA: 05/12/2018  PCP: Patient, No Pcp Per   Patient coming from: Home.  Chief Complaint: Low hemoglobin.  HPI: Joann Robinson is a 27 y.o. female with no significant past medical history has been experiencing bloody diarrhea for last 2 weeks.  Patient has abdominal cramping during diarrheal episodes.  Has had nausea vomiting few times.  No blood in the vomitus.  Subjective feeling of fever and chills.  Denies any recent travel or sick contacts.  Has not used any antibiotics.  Patient had gone to her PCP 2 weeks ago and had basic labs done which showed leukocytosis.  Repeat labs were done again last week after patient symptoms persisted.  Repeat labs showed low hemoglobin than previously and was instructed to come to the ER.  ED Course: In the ER repeat labs show hemoglobin on 6 stool for occult blood positive and CT abdomen pelvis done shows diffuse colitis more in the descending and rectosigmoid area.  GI pathogen panel has been ordered.  2 units of PRBC has been ordered.  Admitted for further management of colitis rectal bleeding and anemia.  Review of Systems: As per HPI, rest all negative.   History reviewed. No pertinent past medical history.  History reviewed. No pertinent surgical history.   reports that she has never smoked. She has never used smokeless tobacco. She reports that she does not drink alcohol or use drugs.  No Known Allergies  Family History - negative for Crohn's disease or ulcerative colitis.  Prior to Admission medications   Medication Sig Start Date End Date Taking? Authorizing Provider  Charcoal Activated (ACTIVATED CHARCOAL PO) Take 1,120 mg by mouth 2 (two) times daily. Activated Charcoal 560 mg Capsules   Yes [provider]  famotidine (PEPCID) 20 MG tablet Take 20 mg by mouth 2 (two) times daily. 05/01/18  Yes [provider]  OVER THE COUNTER MEDICATION Take 30 sprays by  mouth 2 (two) times daily. Sovereign Silver 50 mcg Spray - 30 sprays under the tongue and hold for 30 seconds then swallow   Yes [provider]  promethazine (PHENERGAN) 25 MG tablet Take 25 mg by mouth every 8 (eight) hours as needed for nausea or vomiting.   Yes [provider]    Physical Exam: Vitals:   05/12/18 2133 05/12/18 2221 05/12/18 2300 05/12/18 2301  BP: (!) 138/97 (!) 145/91 (!) 140/91 (!) 140/91  Pulse: (!) 123 (!) 119 (!) 124 (!) 124  Resp: (!) 24 (!) 24 (!) 23 (!) 23  Temp:  99.7 F (37.6 C) 98.7 F (37.1 C) 98.7 F (37.1 C)  TempSrc:  Oral Oral Oral  SpO2: 100% 100% 100% 100%  Weight:      Height:          Constitutional: Moderately built and nourished. Vitals:   05/12/18 2133 05/12/18 2221 05/12/18 2300 05/12/18 2301  BP: (!) 138/97 (!) 145/91 (!) 140/91 (!) 140/91  Pulse: (!) 123 (!) 119 (!) 124 (!) 124  Resp: (!) 24 (!) 24 (!) 23 (!) 23  Temp:  99.7 F (37.6 C) 98.7 F (37.1 C) 98.7 F (37.1 C)  TempSrc:  Oral Oral Oral  SpO2: 100% 100% 100% 100%  Weight:      Height:       Eyes: Anicteric no pallor. ENMT: No discharge from the ears eyes nose and mouth. Neck: No mass felt.  No neck rigidity. Respiratory: No rhonchi or crepitations.  Cardiovascular: S1-S2 heard no murmurs appreciated. Abdomen: Soft nontender bowel sounds present. Musculoskeletal: No edema.  No joint effusion. Skin: No rash. Neurologic: Alert awake oriented to time place and person.  Moves all extremities. Psychiatric: Appears normal.  Normal affect.   Labs on Admission: I have personally reviewed following labs and imaging studies  CBC: Recent Labs  Lab 05/12/18 1812  WBC 21.5*  NEUTROABS 16.2*  HGB 6.2*  HCT 20.9*  MCV 70.1*  PLT 469*   Basic Metabolic Panel: Recent Labs  Lab 05/12/18 1811 05/12/18 1812  NA 125*  --   K 2.4*  --   CL 83*  --   CO2 29  --   GLUCOSE 149*  --   BUN 9  --   CREATININE 0.70  --   CALCIUM 7.9*  --   MG  --   1.8   GFR: Estimated Creatinine Clearance: 124.1 mL/min (by C-G formula based on SCr of 0.7 mg/dL). Liver Function Tests: Recent Labs  Lab 05/12/18 1811  AST 13*  ALT 8  ALKPHOS 56  BILITOT 0.8  PROT 6.4*  ALBUMIN 2.4*   Recent Labs  Lab 05/12/18 1811  LIPASE 80*   No results for input(s): AMMONIA in the last 168 hours. Coagulation Profile: No results for input(s): INR, PROTIME in the last 168 hours. Cardiac Enzymes: No results for input(s): CKTOTAL, CKMB, CKMBINDEX, TROPONINI in the last 168 hours. BNP (last 3 results) No results for input(s): PROBNP in the last 8760 hours. HbA1C: No results for input(s): HGBA1C in the last 72 hours. CBG: No results for input(s): GLUCAP in the last 168 hours. Lipid Profile: No results for input(s): CHOL, HDL, LDLCALC, TRIG, CHOLHDL, LDLDIRECT in the last 72 hours. Thyroid Function Tests: No results for input(s): TSH, T4TOTAL, FREET4, T3FREE, THYROIDAB in the last 72 hours. Anemia Panel: Recent Labs    05/12/18 2017 05/12/18 2121  VITAMINB12 815  --   FOLATE 7.5  --   FERRITIN 42  --   TIBC 209*  --   IRON 17*  --   RETICCTPCT  --  2.5   Urine analysis:    Component Value Date/Time   COLORURINE STRAW (A) 05/12/2018 1933   APPEARANCEUR CLEAR 05/12/2018 1933   LABSPEC 1.004 (L) 05/12/2018 1933   PHURINE 7.0 05/12/2018 1933   GLUCOSEU NEGATIVE 05/12/2018 1933   HGBUR MODERATE (A) 05/12/2018 1933   BILIRUBINUR NEGATIVE 05/12/2018 1933   KETONESUR 20 (A) 05/12/2018 1933   PROTEINUR NEGATIVE 05/12/2018 1933   NITRITE NEGATIVE 05/12/2018 1933   LEUKOCYTESUR NEGATIVE 05/12/2018 1933   Sepsis Labs: @LABRCNTIP (procalcitonin:4,lacticidven:4) ) Recent Results (from the past 240 hour(s))  C difficile quick scan w PCR reflex     Status: None   Collection Time: 05/12/18  7:33 PM  Result Value Ref Range Status   C Diff antigen NEGATIVE NEGATIVE Final   C Diff toxin NEGATIVE NEGATIVE Final   C Diff interpretation No C. difficile  detected.  Final    Comment: Performed at [facility], [address], [place], [place] [address]     Radiological Exams on Admission: Ct Abdomen Pelvis W Contrast  Result Date: 05/12/2018 CLINICAL DATA:  Nausea, vomiting.  Generalized abdominal pain. EXAM: CT ABDOMEN AND PELVIS WITH CONTRAST TECHNIQUE: Multidetector CT imaging of the abdomen and pelvis was performed using the standard protocol following bolus administration of intravenous contrast. CONTRAST:  ISOVUE-300 IOPAMIDOL (ISOVUE-300) INJECTION 61% COMPARISON:  None. FINDINGS: Lower chest: Lung bases are clear. No  effusions. Heart is normal size. Hepatobiliary: Diffuse low-density throughout the liver compatible with fatty infiltration. No focal abnormality. Gallbladder unremarkable. Pancreas: No focal abnormality or ductal dilatation. Spleen: No focal abnormality.  Normal size. Adrenals/Urinary Tract: No adrenal abnormality. No focal renal abnormality. No stones or hydronephrosis. Urinary bladder is unremarkable. Stomach/Bowel: There is colonic wall thickening throughout much of the colon compatible with diffuse colitis, most pronounced in the descending colon and rectosigmoid colon. No evidence of bowel obstruction. Stomach and small bowel decompressed, unremarkable. Vascular/Lymphatic: No evidence of aneurysm or adenopathy. There are prominent mesenteric lymph nodes noted adjacent to the rectosigmoid colon, likely reactive. Reproductive: Uterus and adnexa unremarkable.  No mass. Other: No free fluid or free air. Musculoskeletal: No acute bony abnormality. IMPRESSION: Rather diffuse colonic wall thickening, most pronounced in the distal descending colon and rectosigmoid colon compatible with infectious or inflammatory colitis. Fatty infiltration of the liver. Electronically Signed   By: Charlett NoseKevin  Dover M.D.   On: 05/12/2018 22:33    Assessment/Plan Principal Problem:   Symptomatic anemia Active Problems:   Colitis    Diarrhea   Hypokalemia    1. Anemia with colitis -transfused 2 units of PRBC recheck CBC.  Follow anemia panel. 2. Diffuse colitis with rectal bleeding with CAT scan showing more pronounced in the distal descending colon and rectosigmoid area.  Differentials include infectious versus inflammatory.  Follow GI pathogen panel.  Given that patient has bloody diarrhea I have not started patient on antibiotics and will wait for GI pathogen panel.  May need GI consult in the morning given the degree of anemia. 3. Hypokalemia and hyponatremia likely from dehydration from diarrhea and vomiting.  Continue with hydration replace potassium and recheck.  Check magnesium.   DVT prophylaxis: SCDs. Code Status: Full code. Family Communication: Patient's mother and sister at the bedside. Disposition Plan: Home. Consults called: None. Admission status: Observation.   Eduard ClosArshad N Dione Petron MD [facility] Pager [phone].  If 7PM-7AM, please contact night-coverage [email or web address] Password [email or web address]  05/12/2018, 11:42 PM

## 2018-05-13 DIAGNOSIS — E876 Hypokalemia: Secondary | ICD-10-CM | POA: Diagnosis present

## 2018-05-13 DIAGNOSIS — D509 Iron deficiency anemia, unspecified: Secondary | ICD-10-CM | POA: Diagnosis present

## 2018-05-13 DIAGNOSIS — D62 Acute posthemorrhagic anemia: Secondary | ICD-10-CM | POA: Diagnosis present

## 2018-05-13 DIAGNOSIS — E860 Dehydration: Secondary | ICD-10-CM | POA: Diagnosis present

## 2018-05-13 DIAGNOSIS — R000 Tachycardia, unspecified: Secondary | ICD-10-CM | POA: Diagnosis present

## 2018-05-13 DIAGNOSIS — E871 Hypo-osmolality and hyponatremia: Secondary | ICD-10-CM | POA: Diagnosis present

## 2018-05-13 DIAGNOSIS — Z79899 Other long term (current) drug therapy: Secondary | ICD-10-CM | POA: Diagnosis not present

## 2018-05-13 DIAGNOSIS — D649 Anemia, unspecified: Secondary | ICD-10-CM | POA: Diagnosis not present

## 2018-05-13 DIAGNOSIS — K51911 Ulcerative colitis, unspecified with rectal bleeding: Secondary | ICD-10-CM | POA: Diagnosis present

## 2018-05-13 LAB — HEPATIC FUNCTION PANEL
ALT: 10 U/L (ref 0–44)
AST: 13 U/L — ABNORMAL LOW (ref 15–41)
Albumin: 2.3 g/dL — ABNORMAL LOW (ref 3.5–5.0)
Alkaline Phosphatase: 49 U/L (ref 38–126)
BILIRUBIN INDIRECT: 1.3 mg/dL — AB (ref 0.3–0.9)
Bilirubin, Direct: 0.2 mg/dL (ref 0.0–0.2)
Total Bilirubin: 1.5 mg/dL — ABNORMAL HIGH (ref 0.3–1.2)
Total Protein: 5.6 g/dL — ABNORMAL LOW (ref 6.5–8.1)

## 2018-05-13 LAB — CBC WITH DIFFERENTIAL/PLATELET
Basophils Absolute: 0.1 10*3/uL (ref 0.0–0.1)
Basophils Relative: 1 %
Eosinophils Absolute: 0.1 10*3/uL (ref 0.0–0.5)
Eosinophils Relative: 0 %
HCT: 27.1 % — ABNORMAL LOW (ref 36.0–46.0)
Hemoglobin: 8.5 g/dL — ABNORMAL LOW (ref 12.0–15.0)
Lymphocytes Relative: 7 %
Lymphs Abs: 1.5 10*3/uL (ref 0.7–4.0)
MCH: 23.9 pg — AB (ref 26.0–34.0)
MCHC: 31.4 g/dL (ref 30.0–36.0)
MCV: 76.3 fL — ABNORMAL LOW (ref 80.0–100.0)
MONO ABS: 2.8 10*3/uL — AB (ref 0.1–1.0)
Metamyelocytes Relative: 2 %
Monocytes Relative: 12 %
Myelocytes: 4 %
Neutro Abs: 16.6 10*3/uL — ABNORMAL HIGH (ref 1.7–7.7)
Neutrophils Relative %: 74 %
Platelets: 381 10*3/uL (ref 150–400)
RBC: 3.55 MIL/uL — ABNORMAL LOW (ref 3.87–5.11)
RDW: 20 % — ABNORMAL HIGH (ref 11.5–15.5)
WBC Morphology: INCREASED
WBC: 22.8 10*3/uL — ABNORMAL HIGH (ref 4.0–10.5)
nRBC: 0.4 % — ABNORMAL HIGH (ref 0.0–0.2)

## 2018-05-13 LAB — BASIC METABOLIC PANEL
ANION GAP: 16 — AB (ref 5–15)
Anion gap: 13 (ref 5–15)
BUN: 8 mg/dL (ref 6–20)
BUN: 7 mg/dL (ref 6–20)
CO2: 24 mmol/L (ref 22–32)
CO2: 21 mmol/L — ABNORMAL LOW (ref 22–32)
CREATININE: 0.67 mg/dL (ref 0.44–1.00)
Calcium: 7.7 mg/dL — ABNORMAL LOW (ref 8.9–10.3)
Calcium: 7.7 mg/dL — ABNORMAL LOW (ref 8.9–10.3)
Chloride: 91 mmol/L — ABNORMAL LOW (ref 98–111)
Chloride: 92 mmol/L — ABNORMAL LOW (ref 98–111)
Creatinine, Ser: 0.6 mg/dL (ref 0.44–1.00)
GFR calc Af Amer: >60 mL/min (ref >60)
GFR calc Af Amer: >60 mL/min (ref >60)
GFR calc non Af Amer: >60 mL/min (ref >60)
GFR calc non Af Amer: >60 mL/min (ref >60)
Glucose, Bld: 126 mg/dL — ABNORMAL HIGH (ref 70–99)
Glucose, Bld: 119 mg/dL — ABNORMAL HIGH (ref 70–99)
Potassium: 2.6 mmol/L — CL (ref 3.5–5.1)
Potassium: 2.8 mmol/L — ABNORMAL LOW (ref 3.5–5.1)
Sodium: 128 mmol/L — ABNORMAL LOW (ref 135–145)
Sodium: 129 mmol/L — ABNORMAL LOW (ref 135–145)

## 2018-05-13 LAB — MAGNESIUM: MAGNESIUM: 2.3 mg/dL (ref 1.7–2.4)

## 2018-05-13 LAB — GASTROINTESTINAL PANEL BY PCR, STOOL (REPLACES STOOL CULTURE)

## 2018-05-13 LAB — ABO/RH: ABO/RH(D): O POS

## 2018-05-13 LAB — LACTIC ACID, PLASMA: LACTIC ACID, VENOUS: 1 mmol/L (ref 0.5–1.9)

## 2018-05-13 MED ORDER — POTASSIUM CHLORIDE IN NACL 20-0.9 MEQ/L-% IV SOLN
INTRAVENOUS | Status: DC
  Administered 2018-05-13 – 2018-05-14 (×3): via INTRAVENOUS
  Filled 2018-05-13 (×3): qty 1000

## 2018-05-13 MED ORDER — POTASSIUM CHLORIDE CRYS ER 20 MEQ PO TBCR
40.0000 meq | EXTENDED_RELEASE_TABLET | ORAL | Status: AC
  Administered 2018-05-13 (×2): 40 meq via ORAL
  Filled 2018-05-13 (×2): qty 2

## 2018-05-13 NOTE — Plan of Care (Signed)

## 2018-05-13 NOTE — Progress Notes (Addendum)
Transfusing the second unit of blood.Finishing. The lab will draw morning labs @ 0830

## 2018-05-13 NOTE — Consult Note (Signed)
[facility] CON[facility] Reason for consult: Bloody diarrhea, severe anemia and weight loss Referring Physician: [facility] no PCP here in town  Joann Robinson is an 27 y.o. female.  HPI: Patient does not have any significant past medical history.  She has had episodes of bloody diarrhea.  First episode lasted several weeks and was in April of this year was characterized by multiple loose stools with blood.  She apparently recover from this and her stools did firm up.  For the past 3 to 4 weeks she has had progressive diarrhea with bleeding cramping abdominal pain.  She has not felt like eating and has lost approximately 10 to 12 pounds in the past 3 weeks.  Her family notes that she is been eating a lot of ice.  She is not traveled outside of the country and no one else is actually been sick.  She does work at [employer] where she does come in contact with samples in plastic bags. She does not handle stool samples directly.  She has not been on any antibiotics recently.  She had some labs drawn in urgent care doctor showing leukocytosis hypokalemia and hyponatremia they sent her to the emergency room.Her labs here if indicated a potassium of 2.4 sodium 125 albumin 2.4 with normal LFTs.  Her iron is markedly low with a percent saturation.  Hemoglobin was low at 6.2 with white count of 22,000.  CT scan showed diffuse colonic wall thickening primarily in the distal descending and rectosigmoid and fatty liver.  The patient is here today with her mother and they both specifically deny any known family history of Crohn's or ulcerative colitis.  C. difficile toxin is negative GI pathogen panel still pending. History reviewed. No pertinent past medical history.  History reviewed. No pertinent surgical history.  History reviewed. No pertinent family history.  Social History:  reports that she has never smoked. She has never used smokeless tobacco. She reports that she does not drink alcohol or use  drugs.  Allergies: No Known Allergies  Medications; Prior to Admission medications   Medication Sig Start Date End Date Taking? Authorizing Provider  Charcoal Activated (ACTIVATED CHARCOAL PO) Take 1,120 mg by mouth 2 (two) times daily. Activated Charcoal 560 mg Capsules   Yes [provider]  famotidine (PEPCID) 20 MG tablet Take 20 mg by mouth 2 (two) times daily. 05/01/18  Yes [provider]  OVER THE COUNTER MEDICATION Take 30 sprays by mouth 2 (two) times daily. Sovereign Silver 50 mcg Spray - 30 sprays under the tongue and hold for 30 seconds then swallow   Yes [provider]  promethazine (PHENERGAN) 25 MG tablet Take 25 mg by mouth every 8 (eight) hours as needed for nausea or vomiting.   Yes [provider]   . potassium chloride  40 mEq Oral Q4H   PRN Meds acetaminophen **OR** acetaminophen, ondansetron **OR** ondansetron (ZOFRAN) IV Results for orders placed or performed during the hospital encounter of 05/12/18 (from the past 48 hour(s))  Lipase, blood     Status: Abnormal   Collection Time: 05/12/18  6:11 PM  Result Value Ref Range   Lipase 80 (H) 11 - 51 U/L    Comment: Performed at [facility], [address] [place], [place] [address]  Comprehensive metabolic panel     Status: Abnormal   Collection Time: 05/12/18  6:11 PM  Result Value Ref Range   Sodium 125 (L) 135 - 145 mmol/L   Potassium 2.4 (LL)   3.5 - 5.1 mmol/L    Comment: CRITICAL RESULT CALLED TO, READ BACK BY AND VERIFIED WITH: COLES,L RN [id number] COVINGTON,N    Chloride 83 (L) 98 - 111 mmol/L   CO2 29 22 - 32 mmol/L   Glucose, Bld 149 (H) 70 - 99 mg/dL   BUN 9 6 - 20 mg/dL   Creatinine, Ser 0.70 0.44 - 1.00 mg/dL   Calcium 7.9 (L) 8.9 - 10.3 mg/dL   Total Protein 6.4 (L) 6.5 - 8.1 g/dL   Albumin 2.4 (L) 3.5 - 5.0 g/dL   AST 13 (L) 15 - 41 U/L   ALT 8 0 - 44 U/L   Alkaline Phosphatase 56 38 - 126 U/L   Total Bilirubin 0.8 0.3 - 1.2 mg/dL    GFR calc non Af Amer >60 >60 mL/min   GFR calc Af Amer >60 >60 mL/min   Anion gap 13 5 - 15    Comment: Performed at [facility], [address] [place], [place] [address]  CBC with Differential     Status: Abnormal   Collection Time: 05/12/18  6:12 PM  Result Value Ref Range   WBC 21.5 (H) 4.0 - 10.5 K/uL   RBC 2.98 (L) 3.87 - 5.11 MIL/uL   Hemoglobin 6.2 (LL) 12.0 - 15.0 g/dL    Comment: Reticulocyte Hemoglobin testing may be clinically indicated, consider ordering this additional test [id number] THIS CRITICAL RESULT HAS VERIFIED AND BEEN CALLED TO B.JESSEE BY VINCENT WILKINS ON 12 02 2019 AT 1857, AND HAS BEEN READ BACK. CRITICAL RESULT VERIFIED    HCT 20.9 (L) 36.0 - 46.0 %   MCV 70.1 (L) 80.0 - 100.0 fL   MCH 20.8 (L) 26.0 - 34.0 pg   MCHC 29.7 (L) 30.0 - 36.0 g/dL   RDW 16.9 (H) 11.5 - 15.5 %   Platelets 469 (H) 150 - 400 K/uL   nRBC 0.5 (H) 0.0 - 0.2 %   Neutrophils Relative % 75 %   Neutro Abs 16.2 (H) 1.7 - 7.7 K/uL   Lymphocytes Relative 8 %   Lymphs Abs 1.7 0.7 - 4.0 K/uL   Monocytes Relative 11 %   Monocytes Absolute 2.3 (H) 0.1 - 1.0 K/uL   Eosinophils Relative 0 %   Eosinophils Absolute 0.0 0.0 - 0.5 K/uL   Basophils Relative 0 %   Basophils Absolute 0.1 0.0 - 0.1 K/uL   Immature Granulocytes 6 %   Abs Immature Granulocytes 1.20 (H) 0.00 - 0.07 K/uL    Comment: Performed at [facility], [address] [place], [place] [address]  Magnesium     Status: None   Collection Time: 05/12/18  6:12 PM  Result Value Ref Range   Magnesium 1.8 1.7 - 2.4 mg/dL    Comment: [facility] at  [facility], [address] [place], [place] [address]  I-Stat beta hCG blood, ED     Status: None   Collection Time: 05/12/18  6:25 PM  Result Value Ref Range   I-stat hCG, quantitative <5.0 <5 mIU/mL   Comment 3            Comment:   GEST. AGE      CONC.  (mIU/mL)   <=1 WEEK        5 - 50     2 WEEKS       50 - 500     3  WEEKS       100 - 10,000       4 WEEKS     1,000 - 30,000        FEMALE AND NON-PREGNANT FEMALE:     LESS THAN 5 mIU/mL   Urinalysis, Routine w reflex microscopic     Status: Abnormal   Collection Time: 05/12/18  7:33 PM  Result Value Ref Range   Color, Urine STRAW (A) YELLOW   APPearance CLEAR CLEAR   Specific Gravity, Urine 1.004 (L) 1.005 - 1.030   pH 7.0 5.0 - 8.0   Glucose, UA NEGATIVE NEGATIVE mg/dL   Hgb urine dipstick MODERATE (A) NEGATIVE   Bilirubin Urine NEGATIVE NEGATIVE   Ketones, ur 20 (A) NEGATIVE mg/dL   Protein, ur NEGATIVE NEGATIVE mg/dL   Nitrite NEGATIVE NEGATIVE   Leukocytes, UA NEGATIVE NEGATIVE   RBC / HPF 0-5 0 - 5 RBC/hpf   WBC, UA 11-20 0 - 5 WBC/hpf   Bacteria, UA RARE (A) NONE SEEN   Squamous Epithelial / LPF 0-5 0 - 5    Comment: Performed at [facility], [address] [place], [place] [address]  C difficile quick scan w PCR reflex     Status: None   Collection Time: 05/12/18  7:33 PM  Result Value Ref Range   C Diff antigen NEGATIVE NEGATIVE   C Diff toxin NEGATIVE NEGATIVE   C Diff interpretation No C. difficile detected.     Comment: Performed at [facility], [address] [place], [place] [address]  POC occult blood, ED RN will collect     Status: Abnormal   Collection Time: 05/12/18  7:40 PM  Result Value Ref Range   Fecal Occult Bld POSITIVE (A) NEGATIVE  ABO/Rh     Status: None   Collection Time: 05/12/18  8:16 PM  Result Value Ref Range   ABO/RH(D)      O POS Performed at [facility], [address] [place], [place] [address]   Type and screen [facility]     Status: None (Preliminary result)   Collection Time: 05/12/18  8:17 PM  Result Value Ref Range   ABO/RH(D) O POS    Antibody Screen NEG    Sample Expiration 05/15/2018    Unit Number [id number]    Blood Component Type RED CELLS,LR    Unit division 00    Status of Unit ISSUED,FINAL     Transfusion Status OK TO TRANSFUSE    Crossmatch Result      Compatible Performed at [facility], [address] [place], [place] [address]    Unit Number [id number]    Blood Component Type RED CELLS,LR    Unit division 00    Status of Unit ISSUED    Transfusion Status OK TO TRANSFUSE    Crossmatch Result Compatible   Vitamin B12     Status: None   Collection Time: 05/12/18  8:17 PM  Result Value Ref Range   Vitamin B-12 815 180 - 914 pg/mL    Comment: (NOTE) This assay is not validated for testing neonatal or myeloproliferative syndrome specimens for Vitamin B12 levels. Performed at [facility], [address] [place], [place] [address]   Folate     Status: None   Collection Time: 05/12/18  8:17 PM  Result Value Ref Range   Folate 7.5 >5.9 ng/mL    Comment: Performed at  [facility], [address] [place],  [address]  Iron and TIBC     Status: Abnormal     Collection Time: 05/12/18  8:17 PM  Result Value Ref Range   Iron 17 (L) 28 - 170 ug/dL   TIBC 209 (L) 250 - 450 ug/dL   Saturation Ratios 8 (L) 10.4 - 31.8 %   UIBC 192 ug/dL    Comment: Performed at [facility], [address] [place], [place] [address]  Ferritin     Status: None   Collection Time: 05/12/18  8:17 PM  Result Value Ref Range   Ferritin 42 11 - 307 ng/mL    Comment: Performed at [facility], [address] [place], [place] [address]  Prepare RBC     Status: None   Collection Time: 05/12/18  8:30 PM  Result Value Ref Range   Order Confirmation      ORDER PROCESSED BY BLOOD BANK Performed at [facility], [address] [place], [place] [address]   Reticulocytes     Status: Abnormal   Collection Time: 05/12/18  9:21 PM  Result Value Ref Range   Retic Ct Pct 2.5 0.4 - 3.1 %   RBC. 3.00 (L) 3.87 - 5.11 MIL/uL   Retic Count, Absolute 74.1 19.0 - 186.0 K/uL   Immature  Retic Fract 39.0 (H) 2.3 - 15.9 %    Comment: Performed at [facility], [address] [place], [place] [address]  Basic metabolic panel     Status: Abnormal   Collection Time: 05/13/18  8:38 AM  Result Value Ref Range   Sodium 128 (L) 135 - 145 mmol/L   Potassium 2.6 (LL) 3.5 - 5.1 mmol/L    Comment: CRITICAL RESULT CALLED TO, READ BACK BY AND VERIFIED WITH: MAYS,J @ 0945 ON [id number] BY POTEAT,S    Chloride 91 (L) 98 - 111 mmol/L   CO2 24 22 - 32 mmol/L   Glucose, Bld 126 (H) 70 - 99 mg/dL   BUN 8 6 - 20 mg/dL   Creatinine, Ser 0.67 0.44 - 1.00 mg/dL   Calcium 7.7 (L) 8.9 - 10.3 mg/dL   GFR calc non Af Amer >60 >60 mL/min   GFR calc Af Amer >60 >60 mL/min   Anion gap 13 5 - 15    Comment: Performed at [facility], [address] [place], [place] [address]  Magnesium     Status: None   Collection Time: 05/13/18  8:38 AM  Result Value Ref Range   Magnesium 2.3 1.7 - 2.4 mg/dL    Comment: Performed at [facility], [address] [place], [place] [address]  Hepatic function panel     Status: Abnormal   Collection Time: 05/13/18  8:38 AM  Result Value Ref Range   Total Protein 5.6 (L) 6.5 - 8.1 g/dL   Albumin 2.3 (L) 3.5 - 5.0 g/dL   AST 13 (L) 15 - 41 U/L   ALT 10 0 - 44 U/L   Alkaline Phosphatase 49 38 - 126 U/L   Total Bilirubin 1.5 (H) 0.3 - 1.2 mg/dL   Bilirubin, Direct 0.2 0.0 - 0.2 mg/dL   Indirect Bilirubin 1.3 (H) 0.3 - 0.9 mg/dL    Comment: Performed at [facility], [address] [place], [place] [address]  CBC WITH DIFFERENTIAL     Status: Abnormal   Collection Time: 05/13/18  8:38 AM  Result Value Ref Range   WBC 22.8 (H) 4.0 - 10.5 K/uL   RBC 3.55 (L) 3.87 - 5.11 MIL/uL   Hemoglobin   8.5 (L) 12.0 - 15.0 g/dL    Comment: REPEATED TO VERIFY DELTA CHECK NOTED    HCT 27.1 (L) 36.0 - 46.0 %   MCV 76.3 (L) 80.0 - 100.0 fL    Comment: POST TRANSFUSION SPECIMEN DELTA CHECK NOTED     MCH 23.9 (L) 26.0 - 34.0 pg   MCHC 31.4 30.0 - 36.0 g/dL   RDW 20.0 (H) 11.5 - 15.5 %   Platelets 381 150 - 400 K/uL   nRBC 0.4 (H) 0.0 - 0.2 %   Neutrophils Relative % 74 %    Comment: CORRECTED ON 12/03 AT 0937: PREVIOUSLY REPORTED AS HIDE   Neutro Abs 16.6 (H) 1.7 - 7.7 K/uL   Lymphocytes Relative 7 %   Lymphs Abs 1.5 0.7 - 4.0 K/uL   Monocytes Relative 12 %   Monocytes Absolute 2.8 (H) 0.1 - 1.0 K/uL   Eosinophils Relative 0 %   Eosinophils Absolute 0.1 0.0 - 0.5 K/uL   Basophils Relative 1 %   Basophils Absolute 0.1 0.0 - 0.1 K/uL   WBC Morphology INCREASED BANDS (>20% BANDS)     Comment: MILD LEFT SHIFT (1-5% METAS, OCC MYELO, OCC BANDS)   RBC Morphology RARE NUCLEATED RBC'S    Metamyelocytes Relative 2 %   Myelocytes 4 %   Polychromasia PRESENT     Comment: Performed at [facility], [address] [place], [place] [address]  Lactic acid, plasma     Status: None   Collection Time: 05/13/18  8:38 AM  Result Value Ref Range   Lactic Acid, Venous 1.0 0.5 - 1.9 mmol/L    Comment: Performed at [facility], [address] [place], [place] [address]    Ct Abdomen Pelvis W Contrast  Result Date: 05/12/2018 CLINICAL DATA:  Nausea, vomiting.  Generalized abdominal pain. EXAM: CT ABDOMEN AND PELVIS WITH CONTRAST TECHNIQUE: Multidetector CT imaging of the abdomen and pelvis was performed using the standard protocol following bolus administration of intravenous contrast. CONTRAST:  100mL ISOVUE-300 IOPAMIDOL (ISOVUE-300) INJECTION 61% COMPARISON:  None. FINDINGS: Lower chest: Lung bases are clear. No effusions. Heart is normal size. Hepatobiliary: Diffuse low-density throughout the liver compatible with fatty infiltration. No focal abnormality. Gallbladder unremarkable. Pancreas: No focal abnormality or ductal dilatation. Spleen: No focal abnormality.  Normal size. Adrenals/Urinary Tract: No adrenal abnormality. No focal renal abnormality. No stones  or hydronephrosis. Urinary bladder is unremarkable. Stomach/Bowel: There is colonic wall thickening throughout much of the colon compatible with diffuse colitis, most pronounced in the descending colon and rectosigmoid colon. No evidence of bowel obstruction. Stomach and small bowel decompressed, unremarkable. Vascular/Lymphatic: No evidence of aneurysm or adenopathy. There are prominent mesenteric lymph nodes noted adjacent to the rectosigmoid colon, likely reactive. Reproductive: Uterus and adnexa unremarkable.  No mass. Other: No free fluid or free air. Musculoskeletal: No acute bony abnormality. IMPRESSION: Rather diffuse colonic wall thickening, most pronounced in the distal descending colon and rectosigmoid colon compatible with infectious or inflammatory colitis. Fatty infiltration of the liver. Electronically Signed   By: Kevin  Dover M.D.   On: 05/12/2018 22:33               Blood pressure (!) 148/93, pulse (!) 119, temperature 98.2 F (36.8 C), temperature source Oral, resp. rate 16, height 5' 6" (1.676 m), weight 90.5 kg, last menstrual period 05/11/2018, SpO2 100 %.  Physical exam:   General--withdrawn African-American female ENT--nonicteric mucous membranes dry  neck--supple Heart--regular rate and rhythm without murmurs or gallops   Lungs--clear Abdomen--nondistended and nontender with bowel sounds present Psych--seems somewhat withdrawn.   Assessment: 1, bloody diarrhea/weight loss.  Patient had several episodes of this and this is concerning for IBD.  CT scan suggest primarily left-sided colitis.  I think we need to go ahead and obtain biopsies.  I have discussed this with the patient and her mother.  Plan: We will make her n.p.o. in the morning and plan on performing sigmoidoscopy with biopsy under moderate sedation.  This will be done in the unprepped state if need be we will collect stool and sent for further diagnostic testing.   Carisha Kantor L Aariv Medlock 05/13/2018, 1:39 PM    This note was created using voice recognition software and minor errors may Have occurred unintentionally. Pager: [phone] If no answer or after hours call [phone]    

## 2018-05-13 NOTE — Progress Notes (Signed)
CRITICAL VALUE ALERT  Critical Value:  K 2.6  Date & Time Notied:  05/13/18 0945  Provider Notified: Dr. Roda ShuttersXu  Orders Received/Actions taken:

## 2018-05-13 NOTE — Progress Notes (Signed)
PROGRESS NOTE  Joann HumphreyLisa Robinson XBJ:[id number] DOB: 09-Jan-1991 DOA: 05/12/2018 PCP: Patient, No Pcp Per  HPI/Recap of past 24 hours:  Still has diarrhea, denies pain, no n/v, no fever Family at bedside  Assessment/Plan: Principal Problem:   Symptomatic anemia Active Problems:   Colitis   Diarrhea   Hypokalemia  Bloody diarrhea with diffuse colitis/acute blood loss anemia ( microcytic, mcv 70, [place] , reports craving for ice): -cdiff negative, gi pcr panel negative, no fever, denies pain, does has leukocytosis -s/p prbc transfusion -GI consulted  Hyponatremia/hypokalemia/hypomagnesemia: Likely due to GI loss -on ivf, replace k/mag, repeat lab in am   Body mass index is 32.22 kg/m.   Code Status: full  Family Communication: patient and family at bedside  Disposition Plan: not ready to discharge   Consultants:  [facility] Randa Evens[facility]  Procedures:  Per GI  Antibiotics:  none   Objective: BP (!) 148/93   Pulse (!) 119   Temp 98.2 F (36.8 C) (Oral)   Resp 16   Ht 5\' 6"  (1.676 m)   Wt 90.5 kg   LMP 05/11/2018   SpO2 100%   BMI 32.22 kg/m   Intake/Output Summary (Last 24 hours) at 05/13/2018 1139 Last data filed at 05/13/2018 0630 Gross per 24 hour  Intake 3245.09 ml  Output -  Net 3245.09 ml   Filed Weights   05/12/18 1823 05/13/18 0015  Weight: 90.3 kg 90.5 kg    Exam: Patient is examined daily including today on 05/13/2018, exams remain the same as of yesterday except that has changed    General:  NAD  Cardiovascular: RRR  Respiratory: CTABL  Abdomen: Soft/ND/NT, positive BS  Musculoskeletal: No Edema  Neuro: alert, oriented   Data Reviewed: Basic Metabolic Panel: Recent Labs  Lab 05/12/18 1811 05/12/18 1812 05/13/18 0838  NA 125*  --  128*  K 2.4*  --  2.6*  CL 83*  --  91*  CO2 29  --  24  GLUCOSE 149*  --  126*  BUN 9  --  8  CREATININE 0.70  --  0.67  CALCIUM 7.9*  --  7.7*  MG  --  1.8 2.3   Liver Function  Tests: Recent Labs  Lab 05/12/18 1811 05/13/18 0838  AST 13* 13*  ALT 8 10  ALKPHOS 56 49  BILITOT 0.8 1.5*  PROT 6.4* 5.6*  ALBUMIN 2.4* 2.3*   Recent Labs  Lab 05/12/18 1811  LIPASE 80*   No results for input(s): AMMONIA in the last 168 hours. CBC: Recent Labs  Lab 05/12/18 1812 05/13/18 0838  WBC 21.5* 22.8*  NEUTROABS 16.2* 16.6*  HGB 6.2* 8.5*  HCT 20.9* 27.1*  MCV 70.1* 76.3*  PLT 469* 381   Cardiac Enzymes:   No results for input(s): CKTOTAL, CKMB, CKMBINDEX, TROPONINI in the last 168 hours. BNP (last 3 results) No results for input(s): BNP in the last 8760 hours.  ProBNP (last 3 results) No results for input(s): PROBNP in the last 8760 hours.  CBG: No results for input(s): GLUCAP in the last 168 hours.  Recent Results (from the past 240 hour(s))  C difficile quick scan w PCR reflex     Status: None   Collection Time: 05/12/18  7:33 PM  Result Value Ref Range Status   C Diff antigen NEGATIVE NEGATIVE Final   C Diff toxin NEGATIVE NEGATIVE Final   C Diff interpretation No C. difficile detected.  Final    Comment: Performed at [facility], [address], [place], [place] [address]     Studies: Ct Abdomen Pelvis W Contrast  Result Date: 05/12/2018 CLINICAL DATA:  Nausea, vomiting.  Generalized abdominal pain. EXAM: CT ABDOMEN AND PELVIS WITH CONTRAST TECHNIQUE: Multidetector CT imaging of the abdomen and pelvis was performed using the standard protocol following bolus administration of intravenous contrast. CONTRAST:  ISOVUE-300 IOPAMIDOL (ISOVUE-300) INJECTION 61% COMPARISON:  None. FINDINGS: Lower chest: Lung bases are clear. No effusions. Heart is normal size. Hepatobiliary: Diffuse low-density throughout the liver compatible with fatty infiltration. No focal abnormality. Gallbladder unremarkable. Pancreas: No focal abnormality or ductal dilatation. Spleen: No focal abnormality.  Normal size. Adrenals/Urinary Tract: No adrenal  abnormality. No focal renal abnormality. No stones or hydronephrosis. Urinary bladder is unremarkable. Stomach/Bowel: There is colonic wall thickening throughout much of the colon compatible with diffuse colitis, most pronounced in the descending colon and rectosigmoid colon. No evidence of bowel obstruction. Stomach and small bowel decompressed, unremarkable. Vascular/Lymphatic: No evidence of aneurysm or adenopathy. There are prominent mesenteric lymph nodes noted adjacent to the rectosigmoid colon, likely reactive. Reproductive: Uterus and adnexa unremarkable.  No mass. Other: No free fluid or free air. Musculoskeletal: No acute bony abnormality. IMPRESSION: Rather diffuse colonic wall thickening, most pronounced in the distal descending colon and rectosigmoid colon compatible with infectious or inflammatory colitis. Fatty infiltration of the liver. Electronically Signed   By: Charlett Nose M.D.   On: 05/12/2018 22:33    Scheduled Meds: . potassium chloride  40 mEq Oral Q4H    Continuous Infusions: . 0.9 % NaCl with KCl 20 mEq / L       Time spent: , case discussed with GI Dr Randa Evens I have personally reviewed and interpreted on  05/13/2018 daily labs, tele strips, imagings as discussed above under date review session and assessment and plans.  I reviewed all nursing notes, pharmacy notes, consultant notes,  vitals, pertinent old records  I have discussed plan of care as described above with RN , patient and family on 05/13/2018   Albertine Grates MD, PhD  [facility] Pager [phone]. If 7PM-7AM, please contact night-coverage at [email or web address], password [facility] 05/13/2018, 11:39 AM  LOS: 0 days

## 2018-05-13 NOTE — H&P (View-Only) (Signed)
[facility] Reason for consult: Bloody diarrhea, severe anemia and weight loss Referring Physician: [facility] hospital[facility] no PCP here in town  Joann Robinson is an 27 y.o. female.  HPI: Patient does not have any significant past medical history.  She has had episodes of bloody diarrhea.  First episode lasted several weeks and was in April of this year was characterized by multiple loose stools with blood.  She apparently recover from this and her stools did firm up.  For the past 3 to 4 weeks she has had progressive diarrhea with bleeding cramping abdominal pain.  She has not felt like eating and has lost approximately 10 to 12 pounds in the past 3 weeks.  Her family notes that she is been eating a lot of ice.  She is not traveled outside of the country and no one else is actually been sick.  She does work at [employer] where she does come in contact with samples in plastic bags. She does not handle stool samples directly.  She has not been on any antibiotics recently.  She had some labs drawn in urgent care doctor showing leukocytosis hypokalemia and hyponatremia they sent her to the emergency room.Her labs here if indicated a potassium of 2.4 sodium 125 albumin 2.4 with normal LFTs.  Her iron is markedly low with a percent saturation.  Hemoglobin was low at 6.2 with white count of 22,000.  CT scan showed diffuse colonic wall thickening primarily in the distal descending and rectosigmoid and fatty liver.  The patient is here today with her mother and they both specifically deny any known family history of Crohn's or ulcerative colitis.  C. difficile toxin is negative GI pathogen panel still pending. History reviewed. No pertinent past medical history.  History reviewed. No pertinent surgical history.  History reviewed. No pertinent family history.  Social History:  reports that she has never smoked. She has never used smokeless tobacco. She reports that she does not drink alcohol or use  drugs.  Allergies: No Known Allergies  Medications; Prior to Admission medications   Medication Sig Start Date End Date Taking? Authorizing Provider  Charcoal Activated (ACTIVATED CHARCOAL PO) Take 1,120 mg by mouth 2 (two) times daily. Activated Charcoal 560 mg Capsules   Yes [provider]  famotidine (PEPCID) 20 MG tablet Take 20 mg by mouth 2 (two) times daily. 05/01/18  Yes [provider]  OVER THE COUNTER MEDICATION Take 30 sprays by mouth 2 (two) times daily. Sovereign Silver 50 mcg Spray - 30 sprays under the tongue and hold for 30 seconds then swallow   Yes [provider]  promethazine (PHENERGAN) 25 MG tablet Take 25 mg by mouth every 8 (eight) hours as needed for nausea or vomiting.   Yes [provider]   . potassium chloride  40 mEq Oral Q4H   PRN Meds acetaminophen **OR** acetaminophen, ondansetron **OR** ondansetron (ZOFRAN) IV Results for orders placed or performed during the hospital encounter of 05/12/18 (from the past 48 hour(s))  Lipase, blood     Status: Abnormal   Collection Time: 05/12/18  6:11 PM  Result Value Ref Range   Lipase 80 (H) 11 - 51 U/L    Comment: Performed at [facility], [address], [place], [place] [address]  Comprehensive metabolic panel     Status: Abnormal   Collection Time: 05/12/18  6:11 PM  Result Value Ref Range   Sodium 125 (L) 135 - 145 mmol/L   Potassium 2.4 (LL)  3.5 - 5.1 mmol/L    Comment: CRITICAL RESULT CALLED TO, READ BACK BY AND VERIFIED WITH: COLES,L RN [id number] COVINGTON,N    Chloride 83 (L) 98 - 111 mmol/L   CO2 29 22 - 32 mmol/L   Glucose, Bld 149 (H) 70 - 99 mg/dL   BUN 9 6 - 20 mg/dL   Creatinine, Ser 1.610.70 0.44 - 1.00 mg/dL   Calcium 7.9 (L) 8.9 - 10.3 mg/dL   Total Protein 6.4 (L) 6.5 - 8.1 g/dL   Albumin 2.4 (L) 3.5 - 5.0 g/dL   AST 13 (L) 15 - 41 U/L   ALT 8 0 - 44 U/L   Alkaline Phosphatase 56 38 - 126 U/L   Total Bilirubin 0.8 0.3 - 1.2 mg/dL    GFR calc non Af Amer >60 >60 mL/min   GFR calc Af Amer >60 >60 mL/min   Anion gap 13 5 - 15    Comment: Performed at [facility], [address] [place], [place] [address]  CBC with Differential     Status: Abnormal   Collection Time: 05/12/18  6:12 PM  Result Value Ref Range   WBC 21.5 (H) 4.0 - 10.5 K/uL   RBC 2.98 (L) 3.87 - 5.11 MIL/uL   Hemoglobin 6.2 (LL) 12.0 - 15.0 g/dL    Comment: Reticulocyte Hemoglobin testing may be clinically indicated, consider ordering this additional test [id number] THIS CRITICAL RESULT HAS VERIFIED AND BEEN CALLED TO B.JESSEE BY VINCENT WILKINS ON 12 02 2019 AT 1857, AND HAS BEEN READ BACK. CRITICAL RESULT VERIFIED    HCT 20.9 (L) 36.0 - 46.0 %   MCV 70.1 (L) 80.0 - 100.0 fL   MCH 20.8 (L) 26.0 - 34.0 pg   MCHC 29.7 (L) 30.0 - 36.0 g/dL   RDW 14.716.9 (H) 82.911.5 - 56.215.5 %   Platelets 469 (H) 150 - 400 K/uL   nRBC 0.5 (H) 0.0 - 0.2 %   Neutrophils Relative % 75 %   Neutro Abs 16.2 (H) 1.7 - 7.7 K/uL   Lymphocytes Relative 8 %   Lymphs Abs 1.7 0.7 - 4.0 K/uL   Monocytes Relative 11 %   Monocytes Absolute 2.3 (H) 0.1 - 1.0 K/uL   Eosinophils Relative 0 %   Eosinophils Absolute 0.0 0.0 - 0.5 K/uL   Basophils Relative 0 %   Basophils Absolute 0.1 0.0 - 0.1 K/uL   Immature Granulocytes 6 %   Abs Immature Granulocytes 1.20 (H) 0.00 - 0.07 K/uL    Comment: Performed at [facility], [address] [place], [place] [address]  Magnesium     Status: None   Collection Time: 05/12/18  6:12 PM  Result Value Ref Range   Magnesium 1.8 1.7 - 2.4 mg/dL    Comment: Performed at [facility], [address] [place], [place] [address]  I-Stat beta hCG blood, ED     Status: None   Collection Time: 05/12/18  6:25 PM  Result Value Ref Range   I-stat hCG, quantitative <5.0 <5 mIU/mL   Comment 3            Comment:   GEST. AGE      CONC.  (mIU/mL)   <=1 WEEK        5 - 50     2 WEEKS       50 - 500     3  WEEKS       100 - 10,000  4 WEEKS     1,000 - 30,000        FEMALE AND NON-PREGNANT FEMALE:     LESS THAN 5 mIU/mL   Urinalysis, Routine w reflex microscopic     Status: Abnormal   Collection Time: 05/12/18  7:33 PM  Result Value Ref Range   Color, Urine STRAW (A) YELLOW   APPearance CLEAR CLEAR   Specific Gravity, Urine 1.004 (L) 1.005 - 1.030   pH 7.0 5.0 - 8.0   Glucose, UA NEGATIVE NEGATIVE mg/dL   Hgb urine dipstick MODERATE (A) NEGATIVE   Bilirubin Urine NEGATIVE NEGATIVE   Ketones, ur 20 (A) NEGATIVE mg/dL   Protein, ur NEGATIVE NEGATIVE mg/dL   Nitrite NEGATIVE NEGATIVE   Leukocytes, UA NEGATIVE NEGATIVE   RBC / HPF 0-5 0 - 5 RBC/hpf   WBC, UA 11-20 0 - 5 WBC/hpf   Bacteria, UA RARE (A) NONE SEEN   Squamous Epithelial / LPF 0-5 0 - 5    Comment: Performed at [facility], [address], [place], [place] [address]  C difficile quick scan w PCR reflex     Status: None   Collection Time: 05/12/18  7:33 PM  Result Value Ref Range   C Diff antigen NEGATIVE NEGATIVE   C Diff toxin NEGATIVE NEGATIVE   C Diff interpretation No C. difficile detected.     Comment: Performed at [facility], [address], [place], [place] [address]  POC occult blood, ED RN will collect     Status: Abnormal   Collection Time: 05/12/18  7:40 PM  Result Value Ref Range   Fecal Occult Bld POSITIVE (A) NEGATIVE  ABO/Rh     Status: None   Collection Time: 05/12/18  8:16 PM  Result Value Ref Range   ABO/RH(D)      O POS Performed at [facility], [address] [place], [place] [address]   Type and screen [facility]     Status: None (Preliminary result)   Collection Time: 05/12/18  8:17 PM  Result Value Ref Range   ABO/RH(D) O POS    Antibody Screen NEG    Sample Expiration 05/15/2018    Unit Number [id number]    Blood Component Type RED CELLS,LR    Unit division 00    Status of Unit ISSUED,FINAL     Transfusion Status OK TO TRANSFUSE    Crossmatch Result      Compatible Performed at [facility], [address], [place], [place] [address]    Unit Number [id number]    Blood Component Type RED CELLS,LR    Unit division 00    Status of Unit ISSUED    Transfusion Status OK TO TRANSFUSE    Crossmatch Result Compatible   Vitamin B12     Status: None   Collection Time: 05/12/18  8:17 PM  Result Value Ref Range   Vitamin B-12 815 180 - 914 pg/mL    Comment: (NOTE) This assay is not validated for testing neonatal or myeloproliferative syndrome specimens for Vitamin B12 levels. Performed at [facility], [address], [place], [place] [address]   Folate     Status: None   Collection Time: 05/12/18  8:17 PM  Result Value Ref Range   Folate 7.5 >5.9 ng/mL    Comment: Performed at [facility], [address] [place], [place] [address]  Iron and TIBC     Status: Abnormal  Collection Time: 05/12/18  8:17 PM  Result Value Ref Range   Iron 17 (L) 28 - 170 ug/dL   TIBC 161 (L) 096 - 045 ug/dL   Saturation Ratios 8 (L) 10.4 - 31.8 %   UIBC 192 ug/dL    Comment: Performed at [facility], [address] [place], [place] [address]  Ferritin     Status: None   Collection Time: 05/12/18  8:17 PM  Result Value Ref Range   Ferritin 42 11 - 307 ng/mL    Comment: Performed at [facility], [address] [place], [place] [address]  Prepare RBC     Status: None   Collection Time: 05/12/18  8:30 PM  Result Value Ref Range   Order Confirmation      ORDER PROCESSED BY BLOOD BANK Performed at [facility], [address] [place], [place] [address]   Reticulocytes     Status: Abnormal   Collection Time: 05/12/18  9:21 PM  Result Value Ref Range   Retic Ct Pct 2.5 0.4 - 3.1 %   RBC. 3.00 (L) 3.87 - 5.11 MIL/uL   Retic Count, Absolute 74.1 19.0 - 186.0 K/uL   Immature  Retic Fract 39.0 (H) 2.3 - 15.9 %    Comment: Performed at [facility], [address], [place], [place] [address]  Basic metabolic panel     Status: Abnormal   Collection Time: 05/13/18  8:38 AM  Result Value Ref Range   Sodium 128 (L) 135 - 145 mmol/L   Potassium 2.6 (LL) 3.5 - 5.1 mmol/L    Comment: CRITICAL RESULT CALLED TO, READ BACK BY AND VERIFIED WITH: MAYS,J @ 0945 ON [id number] BY POTEAT,S    Chloride 91 (L) 98 - 111 mmol/L   CO2 24 22 - 32 mmol/L   Glucose, Bld 126 (H) 70 - 99 mg/dL   BUN 8 6 - 20 mg/dL   Creatinine, Ser 6.57 0.44 - 1.00 mg/dL   Calcium 7.7 (L) 8.9 - 10.3 mg/dL   GFR calc non Af Amer >60 >60 mL/min   GFR calc Af Amer >60 >60 mL/min   Anion gap 13 5 - 15    Comment: Performed at [facility], [address], [place], [place] [address]  Magnesium     Status: None   Collection Time: 05/13/18  8:38 AM  Result Value Ref Range   Magnesium 2.3 1.7 - 2.4 mg/dL    Comment: Performed at [facility], [address] [place], [place] [address]  Hepatic function panel     Status: Abnormal   Collection Time: 05/13/18  8:38 AM  Result Value Ref Range   Total Protein 5.6 (L) 6.5 - 8.1 g/dL   Albumin 2.3 (L) 3.5 - 5.0 g/dL   AST 13 (L) 15 - 41 U/L   ALT 10 0 - 44 U/L   Alkaline Phosphatase 49 38 - 126 U/L   Total Bilirubin 1.5 (H) 0.3 - 1.2 mg/dL   Bilirubin, Direct 0.2 0.0 - 0.2 mg/dL   Indirect Bilirubin 1.3 (H) 0.3 - 0.9 mg/dL    Comment: Performed at [facility], [address], [place], [place] [address]  CBC WITH DIFFERENTIAL     Status: Abnormal   Collection Time: 05/13/18  8:38 AM  Result Value Ref Range   WBC 22.8 (H) 4.0 - 10.5 K/uL   RBC 3.55 (L) 3.87 - 5.11 MIL/uL   Hemoglobin  8.5 (L) 12.0 - 15.0 g/dL    Comment: REPEATED TO VERIFY DELTA CHECK NOTED    HCT 27.1 (L) 36.0 - 46.0 %   MCV 76.3 (L) 80.0 - 100.0 fL    Comment: POST TRANSFUSION SPECIMEN DELTA CHECK NOTED     MCH 23.9 (L) 26.0 - 34.0 pg   MCHC 31.4 30.0 - 36.0 g/dL   RDW 40.9 (H) 81.1 - 91.4 %   Platelets 381 150 - 400 K/uL   nRBC 0.4 (H) 0.0 - 0.2 %   Neutrophils Relative % 74 %    Comment: CORRECTED ON 12/03 AT 7829: PREVIOUSLY REPORTED AS HIDE   Neutro Abs 16.6 (H) 1.7 - 7.7 K/uL   Lymphocytes Relative 7 %   Lymphs Abs 1.5 0.7 - 4.0 K/uL   Monocytes Relative 12 %   Monocytes Absolute 2.8 (H) 0.1 - 1.0 K/uL   Eosinophils Relative 0 %   Eosinophils Absolute 0.1 0.0 - 0.5 K/uL   Basophils Relative 1 %   Basophils Absolute 0.1 0.0 - 0.1 K/uL   WBC Morphology INCREASED BANDS (>20% BANDS)     Comment: MILD LEFT SHIFT (1-5% METAS, OCC MYELO, OCC BANDS)   RBC Morphology RARE NUCLEATED RBC'S    Metamyelocytes Relative 2 %   Myelocytes 4 %   Polychromasia PRESENT     Comment: Performed at [facility], [address] [place], [place] [address]  Lactic acid, plasma     Status: None   Collection Time: 05/13/18  8:38 AM  Result Value Ref Range   Lactic Acid, Venous 1.0 0.5 - 1.9 mmol/L    Comment: Performed at [facility], [address] [place], [place] [address]    Ct Abdomen Pelvis W Contrast  Result Date: 05/12/2018 CLINICAL DATA:  Nausea, vomiting.  Generalized abdominal pain. EXAM: CT ABDOMEN AND PELVIS WITH CONTRAST TECHNIQUE: Multidetector CT imaging of the abdomen and pelvis was performed using the standard protocol following bolus administration of intravenous contrast. CONTRAST:  ISOVUE-300 IOPAMIDOL (ISOVUE-300) INJECTION 61% COMPARISON:  None. FINDINGS: Lower chest: Lung bases are clear. No effusions. Heart is normal size. Hepatobiliary: Diffuse low-density throughout the liver compatible with fatty infiltration. No focal abnormality. Gallbladder unremarkable. Pancreas: No focal abnormality or ductal dilatation. Spleen: No focal abnormality.  Normal size. Adrenals/Urinary Tract: No adrenal abnormality. No focal renal abnormality. No stones  or hydronephrosis. Urinary bladder is unremarkable. Stomach/Bowel: There is colonic wall thickening throughout much of the colon compatible with diffuse colitis, most pronounced in the descending colon and rectosigmoid colon. No evidence of bowel obstruction. Stomach and small bowel decompressed, unremarkable. Vascular/Lymphatic: No evidence of aneurysm or adenopathy. There are prominent mesenteric lymph nodes noted adjacent to the rectosigmoid colon, likely reactive. Reproductive: Uterus and adnexa unremarkable.  No mass. Other: No free fluid or free air. Musculoskeletal: No acute bony abnormality. IMPRESSION: Rather diffuse colonic wall thickening, most pronounced in the distal descending colon and rectosigmoid colon compatible with infectious or inflammatory colitis. Fatty infiltration of the liver. Electronically Signed   By: Charlett Nose M.D.   On: 05/12/2018 22:33               Blood pressure (!) 148/93, pulse (!) 119, temperature 98.2 F (36.8 C), temperature source Oral, resp. rate 16, height 5\' 6"  (1.676 m), weight 90.5 kg, last menstrual period 05/11/2018, SpO2 100 %.  Physical exam:   General--withdrawn African-American female ENT--nonicteric mucous membranes dry  neck--supple Heart--regular rate and rhythm without murmurs or gallops  Lungs--clear Abdomen--nondistended and nontender with bowel sounds present Psych--seems somewhat withdrawn.   Assessment: 1, bloody diarrhea/weight loss.  Patient had several episodes of this and this is concerning for IBD.  CT scan suggest primarily left-sided colitis.  I think we need to go ahead and obtain biopsies.  I have discussed this with the patient and her mother.  Plan: We will make her n.p.o. in the morning and plan on performing sigmoidoscopy with biopsy under moderate sedation.  This will be done in the unprepped state if need be we will collect stool and sent for further diagnostic testing.   Tresea Mall 05/13/2018, 1:39 PM    This note was created using voice recognition software and minor errors may Have occurred unintentionally. Pager: [phone] If no answer or after hours call [phone]

## 2018-05-14 ENCOUNTER — Encounter (HOSPITAL_COMMUNITY)
Admission: EM | Disposition: A | Discharge status: Home / Self Care | Payer: Self-pay | Source: Home / Self Care | Attending: Internal Medicine

## 2018-05-14 ENCOUNTER — Encounter (HOSPITAL_COMMUNITY): Payer: Self-pay | Admitting: Gastroenterology

## 2018-05-14 HISTORY — PX: FLEXIBLE SIGMOIDOSCOPY: SHX5431

## 2018-05-14 HISTORY — PX: BIOPSY: SHX5522

## 2018-05-14 LAB — BASIC METABOLIC PANEL
Anion gap: 12 (ref 5–15)
BUN: 7 mg/dL (ref 6–20)
CO2: 20 mmol/L — ABNORMAL LOW (ref 22–32)
Calcium: 7.6 mg/dL — ABNORMAL LOW (ref 8.9–10.3)
Chloride: 98 mmol/L (ref 98–111)
Creatinine, Ser: 0.6 mg/dL (ref 0.44–1.00)
GFR calc Af Amer: >60 mL/min (ref >60)
Glucose, Bld: 108 mg/dL — ABNORMAL HIGH (ref 70–99)
Potassium: 3.4 mmol/L — ABNORMAL LOW (ref 3.5–5.1)
Sodium: 130 mmol/L — ABNORMAL LOW (ref 135–145)

## 2018-05-14 LAB — CBC WITH DIFFERENTIAL/PLATELET
Abs Immature Granulocytes: 1.2 10*3/uL — ABNORMAL HIGH (ref 0.00–0.07)
BASOS PCT: 0 %
Band Neutrophils: 15 %
Basophils Absolute: 0 10*3/uL (ref 0.0–0.1)
EOS ABS: 0.2 10*3/uL (ref 0.0–0.5)
Eosinophils Relative: 1 %
HCT: 22.9 % — ABNORMAL LOW (ref 36.0–46.0)
Hemoglobin: 7.1 g/dL — ABNORMAL LOW (ref 12.0–15.0)
Lymphocytes Relative: 4 %
Lymphs Abs: 0.9 10*3/uL (ref 0.7–4.0)
MCH: 24 pg — ABNORMAL LOW (ref 26.0–34.0)
MCHC: 31 g/dL (ref 30.0–36.0)
MCV: 77.4 fL — ABNORMAL LOW (ref 80.0–100.0)
Metamyelocytes Relative: 1 %
Monocytes Absolute: 1.2 10*3/uL — ABNORMAL HIGH (ref 0.1–1.0)
Monocytes Relative: 5 %
Myelocytes: 4 %
Neutro Abs: 20.1 10*3/uL — ABNORMAL HIGH (ref 1.7–7.7)
Neutrophils Relative %: 70 %
PLATELETS: 359 10*3/uL (ref 150–400)
RBC: 2.96 MIL/uL — AB (ref 3.87–5.11)
RDW: 19.4 % — ABNORMAL HIGH (ref 11.5–15.5)
WBC: 23.6 10*3/uL — ABNORMAL HIGH (ref 4.0–10.5)
nRBC: 0.5 % — ABNORMAL HIGH (ref 0.0–0.2)

## 2018-05-14 LAB — HIV ANTIBODY (ROUTINE TESTING W REFLEX): HIV Screen 4th Generation wRfx: NONREACTIVE

## 2018-05-14 LAB — TSH: TSH: 2.249 u[IU]/mL (ref 0.350–4.500)

## 2018-05-14 LAB — SEDIMENTATION RATE: Sed Rate: 44 mm/hr — ABNORMAL HIGH (ref 0–22)

## 2018-05-14 LAB — MAGNESIUM: Magnesium: 1.9 mg/dL (ref 1.7–2.4)

## 2018-05-14 LAB — C-REACTIVE PROTEIN: CRP: 12.5 mg/dL — AB (ref <1.0)

## 2018-05-14 LAB — PREPARE RBC (CROSSMATCH)

## 2018-05-14 SURGERY — SIGMOIDOSCOPY, FLEXIBLE
Anesthesia: Moderate Sedation

## 2018-05-14 MED ORDER — CIPROFLOXACIN IN D5W 400 MG/200ML IV SOLN
400.0000 mg | Freq: Two times a day (BID) | INTRAVENOUS | Status: DC
  Administered 2018-05-14 – 2018-05-17 (×7): 400 mg via INTRAVENOUS
  Filled 2018-05-14 (×7): qty 200

## 2018-05-14 MED ORDER — FENTANYL CITRATE (PF) 100 MCG/2ML IJ SOLN
INTRAMUSCULAR | Status: DC | PRN
  Administered 2018-05-14 (×3): 25 ug via INTRAVENOUS

## 2018-05-14 MED ORDER — SODIUM CHLORIDE 0.9 % IV BOLUS
500.0000 mL | Freq: Once | INTRAVENOUS | Status: AC
  Administered 2018-05-14: 500 mL via INTRAVENOUS

## 2018-05-14 MED ORDER — MIDAZOLAM HCL (PF) 5 MG/ML IJ SOLN
INTRAMUSCULAR | Status: AC
  Filled 2018-05-14: qty 2

## 2018-05-14 MED ORDER — MIDAZOLAM HCL (PF) 10 MG/2ML IJ SOLN
INTRAMUSCULAR | Status: DC | PRN
  Administered 2018-05-14: 1 mg via INTRAVENOUS
  Administered 2018-05-14 (×2): 2 mg via INTRAVENOUS

## 2018-05-14 MED ORDER — SODIUM CHLORIDE 0.9 % IV SOLN: INTRAVENOUS | Status: DC

## 2018-05-14 MED ORDER — METHYLPREDNISOLONE SODIUM SUCC 125 MG IJ SOLR
80.0000 mg | Freq: Two times a day (BID) | INTRAMUSCULAR | Status: DC
  Administered 2018-05-14 – 2018-05-17 (×7): 80 mg via INTRAVENOUS
  Filled 2018-05-14 (×7): qty 2

## 2018-05-14 MED ORDER — METRONIDAZOLE IN NACL 5-0.79 MG/ML-% IV SOLN
500.0000 mg | Freq: Three times a day (TID) | INTRAVENOUS | Status: DC
  Administered 2018-05-14 – 2018-05-17 (×10): 500 mg via INTRAVENOUS
  Filled 2018-05-14 (×16): qty 100

## 2018-05-14 MED ORDER — POTASSIUM CHLORIDE IN NACL 20-0.9 MEQ/L-% IV SOLN
INTRAVENOUS | Status: DC
  Administered 2018-05-14 (×2): via INTRAVENOUS
  Filled 2018-05-14 (×2): qty 1000

## 2018-05-14 MED ORDER — POTASSIUM CHLORIDE CRYS ER 20 MEQ PO TBCR
40.0000 meq | EXTENDED_RELEASE_TABLET | Freq: Once | ORAL | Status: AC
  Administered 2018-05-14: 40 meq via ORAL
  Filled 2018-05-14: qty 2

## 2018-05-14 MED ORDER — FENTANYL CITRATE (PF) 100 MCG/2ML IJ SOLN
INTRAMUSCULAR | Status: AC
  Filled 2018-05-14: qty 2

## 2018-05-14 MED ORDER — SODIUM CHLORIDE 0.9% IV SOLUTION
Freq: Once | INTRAVENOUS | Status: AC
  Administered 2018-05-14: 17:00:00 via INTRAVENOUS

## 2018-05-14 NOTE — Progress Notes (Signed)
PROGRESS NOTE    Joann HumphreyLisa Robinson  OZH:[id number]:[id number] DOB: [birth year] DOA: 05/12/2018 PCP: Patient, No Pcp Per    Brief Narrative: 27 year old with no significant past medical history, who presented with 2 weeks history of bloody diarrhea, accompanied with cramping abdominal pain. Evaluation in the emergency department show a hemoglobin at 6, occult blood positive.  CT abdomen and pelvis showed diffuse colitis. GI pathogen negative C. difficile negative.  GI was consulted patient underwent flexible sigmoidoscopy 05/14/2018 which showed severe colitis.  I discussed case with Dr. Randa EvensEdwards who recommended to start IV antibiotics and IV Solu-Medrol.    Assessment & Plan:   Principal Problem:   Symptomatic anemia Active Problems:   Colitis   Diarrhea   Hypokalemia   Severe colitis; Continue with IV fluids Started on IV Solu-Medrol with concern for inflammatory bowel disease. Also discussed with GI recommends to start ciprofloxacin and Flagyl. Repeat labs in the morning. Stool culture viral culture ordered. Anemia; acute blood loss anemia and iron deficiency anemia Plan to transfuse 1 unit of packed red blood cell today. She has received 2 units previously. Repeat hemoglobin in the morning.  Tachycardia; IV fluids, IV bolus.  Blood transfusion.  Hypokalemia continue with IV replacement.   RN Pressure Injury Documentation:    Malnutrition Type:      Malnutrition Characteristics:      Nutrition Interventions:     Estimated body mass index is 32.22 kg/m as calculated from the following:   Height as of this encounter: 5\' 6"  (1.676 m).   Weight as of this encounter: 90.5 kg.   DVT prophylaxis: SCDs, no anticoagulation due to GI bleed Code Status: Full code Family Communication: Discussed with mother who was at bedside Disposition Plan: Remain in the hospital for IV fluids, IV antibiotics, blood transfusion.  Consultants:   Dr. Randa EvensEdwards   Procedures:    sigmoidoscopy  05/14/2018   Antimicrobials:   Ciprofloxacin and Flagyl 05/14/2018   Subjective: She continues to have persistent diarrhea she had more than 5 watery diarrhea last night.  She had a episode this morning, bloody.  She reports abdominal pain cramping while she is having the diarrhea.  Objective: Vitals:   05/14/18 1310 05/14/18 1346 05/14/18 1410 05/14/18 1619  BP: (!) 152/110 126/78  (!) 134/96  Pulse: (!) 123 (!) 121 (!) 120 (!) 118  Resp: (!) 24 (!) 22 20   Temp:  100.2 F (37.9 C)  98.6 F (37 C)  TempSrc:  Oral  Oral  SpO2: 99% 100%  100%  Weight:      Height:        Intake/Output Summary (Last 24 hours) at 05/14/2018 1637 Last data filed at 05/14/2018 1635 Gross per 24 hour  Intake 2876.9 ml  Output -  Net 2876.9 ml   Filed Weights   05/12/18 1823 05/13/18 0015  Weight: 90.3 kg 90.5 kg    Examination:  General exam: Sick appearing Respiratory system: Normal respiratory effort , clear to auscultation Cardiovascular system: S1 & S2 heard, RRR. No JVD, murmurs, rubs, gallops or clicks. No pedal edema. Gastrointestinal system: Abdomen soft, tender, no rigidity. Central nervous system: Alert and oriented. No focal neurological deficits. Extremities: Symmetric 5 x 5 power. Skin: No rashes, lesions or ulcers Psychiatry: Judgement and insight appear normal. Mood & affect appropriate.     Data Reviewed: I have personally reviewed following labs and imaging studies  CBC: Recent Labs  Lab 05/12/18 1812 05/13/18 0838 05/14/18 0544  WBC 21.5* 22.8* 23.6*  NEUTROABS  16.2* 16.6* 20.1*  HGB 6.2* 8.5* 7.1*  HCT 20.9* 27.1* 22.9*  MCV 70.1* 76.3* 77.4*  PLT 469* 381 359   Basic Metabolic Panel: Recent Labs  Lab 05/12/18 1811 05/12/18 1812 05/13/18 0838 05/13/18 1605 05/14/18 0544  NA 125*  --  128* 129* 130*  K 2.4*  --  2.6* 2.8* 3.4*  CL 83*  --  91* 92* 98  CO2 29  --  24 21* 20*  GLUCOSE 149*  --  126* 119* 108*  BUN 9  --  8 7 7   CREATININE 0.70  --   0.67 0.60 0.60  CALCIUM 7.9*  --  7.7* 7.7* 7.6*  MG  --  1.8 2.3  --  1.9   GFR: Estimated Creatinine Clearance: 121.8 mL/min (by C-G formula based on SCr of 0.6 mg/dL). Liver Function Tests: Recent Labs  Lab 05/12/18 1811 05/13/18 0838  AST 13* 13*  ALT 8 10  ALKPHOS 56 49  BILITOT 0.8 1.5*  PROT 6.4* 5.6*  ALBUMIN 2.4* 2.3*   Recent Labs  Lab 05/12/18 1811  LIPASE 80*   No results for input(s): AMMONIA in the last 168 hours. Coagulation Profile: No results for input(s): INR, PROTIME in the last 168 hours. Cardiac Enzymes: No results for input(s): CKTOTAL, CKMB, CKMBINDEX, TROPONINI in the last 168 hours. BNP (last 3 results) No results for input(s): PROBNP in the last 8760 hours. HbA1C: No results for input(s): HGBA1C in the last 72 hours. CBG: No results for input(s): GLUCAP in the last 168 hours. Lipid Profile: No results for input(s): CHOL, HDL, LDLCALC, TRIG, CHOLHDL, LDLDIRECT in the last 72 hours. Thyroid Function Tests: Recent Labs    05/14/18 0544  TSH 2.249   Anemia Panel: Recent Labs    05/12/18 2017 05/12/18 2121  VITAMINB12 815  --   FOLATE 7.5  --   FERRITIN 42  --   TIBC 209*  --   IRON 17*  --   RETICCTPCT  --  2.5   Sepsis Labs: Recent Labs  Lab 05/13/18 2956  LATICACIDVEN 1.0    Recent Results (from the past 240 hour(s))  Gastrointestinal Panel by PCR , Stool     Status: None   Collection Time: 05/12/18  7:33 PM  Result Value Ref Range Status   Campylobacter species NOT DETECTED NOT DETECTED Final   Plesimonas shigelloides NOT DETECTED NOT DETECTED Final   Salmonella species NOT DETECTED NOT DETECTED Final   Yersinia enterocolitica NOT DETECTED NOT DETECTED Final   Vibrio species NOT DETECTED NOT DETECTED Final   Vibrio cholerae NOT DETECTED NOT DETECTED Final   Enteroaggregative E coli (EAEC) NOT DETECTED NOT DETECTED Final   Enteropathogenic E coli (EPEC) NOT DETECTED NOT DETECTED Final   Enterotoxigenic E coli (ETEC) NOT  DETECTED NOT DETECTED Final   Shiga like toxin producing E coli (STEC) NOT DETECTED NOT DETECTED Final   Shigella/Enteroinvasive E coli (EIEC) NOT DETECTED NOT DETECTED Final   Cryptosporidium NOT DETECTED NOT DETECTED Final   Cyclospora cayetanensis NOT DETECTED NOT DETECTED Final   Entamoeba histolytica NOT DETECTED NOT DETECTED Final   Giardia lamblia NOT DETECTED NOT DETECTED Final   Adenovirus F40/41 NOT DETECTED NOT DETECTED Final   Astrovirus NOT DETECTED NOT DETECTED Final   Norovirus GI/GII NOT DETECTED NOT DETECTED Final   Rotavirus A NOT DETECTED NOT DETECTED Final   Sapovirus (I, II, IV, and V) NOT DETECTED NOT DETECTED Final    Comment: Performed at [facility], [address] [place], [place] [address]  C difficile quick scan w PCR reflex     Status: None   Collection Time: 05/12/18  7:33 PM  Result Value Ref Range Status   C Diff antigen NEGATIVE NEGATIVE Final   C Diff toxin NEGATIVE NEGATIVE Final   C Diff interpretation No C. difficile detected.  Final    Comment: Performed at [facility], [address] [place], [place] [address]         Radiology Studies: Ct Abdomen Pelvis W Contrast  Result Date: 05/12/2018 CLINICAL DATA:  Nausea, vomiting.  Generalized abdominal pain. EXAM: CT ABDOMEN AND PELVIS WITH CONTRAST TECHNIQUE: Multidetector CT imaging of the abdomen and pelvis was performed using the standard protocol following bolus administration of intravenous contrast. CONTRAST:  ISOVUE-300 IOPAMIDOL (ISOVUE-300) INJECTION 61% COMPARISON:  None. FINDINGS: Lower chest: Lung bases are clear. No effusions. Heart is normal size. Hepatobiliary: Diffuse low-density throughout the liver compatible with fatty infiltration. No focal abnormality. Gallbladder unremarkable. Pancreas: No focal abnormality or ductal dilatation. Spleen: No focal abnormality.  Normal size. Adrenals/Urinary Tract: No adrenal abnormality. No focal renal  abnormality. No stones or hydronephrosis. Urinary bladder is unremarkable. Stomach/Bowel: There is colonic wall thickening throughout much of the colon compatible with diffuse colitis, most pronounced in the descending colon and rectosigmoid colon. No evidence of bowel obstruction. Stomach and small bowel decompressed, unremarkable. Vascular/Lymphatic: No evidence of aneurysm or adenopathy. There are prominent mesenteric lymph nodes noted adjacent to the rectosigmoid colon, likely reactive. Reproductive: Uterus and adnexa unremarkable.  No mass. Other: No free fluid or free air. Musculoskeletal: No acute bony abnormality. IMPRESSION: Rather diffuse colonic wall thickening, most pronounced in the distal descending colon and rectosigmoid colon compatible with infectious or inflammatory colitis. Fatty infiltration of the liver. Electronically Signed   By: Charlett Nose M.D.   On: 05/12/2018 22:33        Scheduled Meds: . methylPREDNISolone (SOLU-MEDROL) injection  80 mg Intravenous Q12H   Continuous Infusions: . 0.9 % NaCl with KCl 20 mEq / L Stopped (05/14/18 1447)  . ciprofloxacin Stopped (05/14/18 1445)  . metronidazole Stopped (05/14/18 1627)     LOS: 1 day    Time spent: 35 minutes     Alba Cory, MD [facility] Pager [phone] If 7PM-7AM, please contact night-coverage [email or web address] Password [facility] 05/14/2018, 4:37 PM

## 2018-05-14 NOTE — Interval H&P Note (Signed)
History and Physical Interval Note:  05/14/2018 12:01 PM  Joann Robinson  has presented today for surgery, with the diagnosis of diarrhea  The various methods of treatment have been discussed with the patient and family. After consideration of risks, benefits and other options for treatment, the patient has consented to  Procedure(s): FLEXIBLE SIGMOIDOSCOPY (N/A) as a surgical intervention .  The patient's history has been reviewed, patient examined, no change in status, stable for surgery.  I have reviewed the patient's chart and labs.  Questions were answered to the patient's satisfaction.     [name]

## 2018-05-14 NOTE — Progress Notes (Signed)
Pt's resting HR noted to be in the 120's today. During day shift yesterday, resting HR maintained in the 110's. Dr. Sunnie Nielsenegalado paged and made aware. Pt asymptomatic. Will continue to monitor.

## 2018-05-15 ENCOUNTER — Encounter (HOSPITAL_COMMUNITY): Payer: Self-pay | Admitting: Gastroenterology

## 2018-05-15 LAB — TYPE AND SCREEN
ABO/RH(D): O POS
Antibody Screen: NEGATIVE
Unit division: 0
Unit division: 0
Unit division: 0
Unit division: 0

## 2018-05-15 LAB — BPAM RBC
Blood Product Expiration Date: 201912212359
Blood Product Expiration Date: 201912272359
Blood Product Expiration Date: 201912292359
Blood Product Expiration Date: 201912302359
ISSUE DATE / TIME: 201912022230
ISSUE DATE / TIME: 201912030353
ISSUE DATE / TIME: 201912041256
ISSUE DATE / TIME: 201912041601
Unit Type and Rh: 5100
Unit Type and Rh: 5100
Unit Type and Rh: 5100
Unit Type and Rh: 5100

## 2018-05-15 LAB — CBC
HEMATOCRIT: 29.7 % — AB (ref 36.0–46.0)
Hemoglobin: 9.4 g/dL — ABNORMAL LOW (ref 12.0–15.0)
MCH: 26 pg (ref 26.0–34.0)
MCHC: 31.6 g/dL (ref 30.0–36.0)
MCV: 82 fL (ref 80.0–100.0)
Platelets: 458 10*3/uL — ABNORMAL HIGH (ref 150–400)
RBC: 3.62 MIL/uL — ABNORMAL LOW (ref 3.87–5.11)
RDW: 22.9 % — ABNORMAL HIGH (ref 11.5–15.5)
WBC: 20.7 10*3/uL — ABNORMAL HIGH (ref 4.0–10.5)
nRBC: 0.7 % — ABNORMAL HIGH (ref 0.0–0.2)

## 2018-05-15 LAB — BASIC METABOLIC PANEL
Anion gap: 11 (ref 5–15)
BUN: 7 mg/dL (ref 6–20)
CO2: 20 mmol/L — ABNORMAL LOW (ref 22–32)
Calcium: 8.1 mg/dL — ABNORMAL LOW (ref 8.9–10.3)
Chloride: 102 mmol/L (ref 98–111)
Creatinine, Ser: 0.59 mg/dL (ref 0.44–1.00)
GFR calc Af Amer: >60 mL/min (ref >60)
GFR calc non Af Amer: >60 mL/min (ref >60)
Glucose, Bld: 160 mg/dL — ABNORMAL HIGH (ref 70–99)
Potassium: 4.4 mmol/L (ref 3.5–5.1)
Sodium: 133 mmol/L — ABNORMAL LOW (ref 135–145)

## 2018-05-15 MED ORDER — SODIUM CHLORIDE 0.9 % IV SOLN
INTRAVENOUS | Status: DC
  Administered 2018-05-15 – 2018-05-18 (×8): via INTRAVENOUS

## 2018-05-15 NOTE — Progress Notes (Signed)
[facility] PROGRESS NOTE Subjective Patient states that she feels better.  Sigmoidoscopy showed diffuse colitis with severe ulcerations up to the descending colon C. difficile negative pathogen panel negative sent stool culture and pathology waiting for those results.  Patient states she is only had 2 stools today still with diarrhea and loose but less frequent  Objective: Vital signs in last 24 hours: Temp:  [98.2 F (36.8 C)-99.4 F (37.4 C)] 99.4 F (37.4 C) (12/05 1257) Pulse Rate:  [94-117] 104 (12/05 1257) Resp:  [12-18] 12 (12/05 0529) BP: (121-153)/(81-105) 125/95 (12/05 1257) SpO2:  [99 %-100 %] 100 % (12/05 0535) Last BM Date: 05/14/18  Intake/Output from previous day: 12/04 0701 - 12/05 0700 In: 3285.8 [P.O.:600; I.V.:1257.2; Blood:385; IV Piggyback:1043.5] Out: -  Intake/Output this shift: No intake/output data recorded.  PE: General--sitting on the potty   Lab Results: Recent Labs    05/12/18 1812 05/13/18 0838 05/14/18 0544 05/15/18 0612  WBC 21.5* 22.8* 23.6* 20.7*  HGB 6.2* 8.5* 7.1* 9.4*  HCT 20.9* 27.1* 22.9* 29.7*  PLT 469* 381 359 458*   BMET Recent Labs    05/12/18 1811 05/13/18 0838 05/13/18 1605 05/14/18 0544 05/15/18 0612  NA 125* 128* 129* 130* 133*  K 2.4* 2.6* 2.8* 3.4* 4.4  CL 83* 91* 92* 98 102  CO2 29 24 21* 20* 20*  CREATININE 0.70 0.67 0.60 0.60 0.59   LFT Recent Labs    05/12/18 1811 05/13/18 0838  PROT 6.4* 5.6*  AST 13* 13*  ALT 8 10  ALKPHOS 56 49  BILITOT 0.8 1.5*  BILIDIR  --  0.2  IBILI  --  1.3*   PT/INR No results for input(s): LABPROT, INR in the last 72 hours. PANCREAS Recent Labs    05/12/18 1811  LIPASE 80*         Studies/Results: No results found.  Medications: I have reviewed the patient's current medications.  Assessment:   1.  Severe colitis.  Marked confluent ulcerations either infectious or more likely initial onset of severe ulcerative colitis.  Seems to be better  after starting high-dose steroids and antibiotics   Plan: We will continue as we are and wait for pathology results and cultures.  Discussed with patient and mother   Tresea MallJames L Caylon Saine 05/15/2018, 4:22 PM  This note was created using voice recognition software. Minor errors may Have occurred unintentionally.  Pager: [phone] If no answer or after hours call [phone]

## 2018-05-15 NOTE — Op Note (Signed)
[facility] Patient Name: Joann Robinson Procedure Date: 05/14/2018 MRN: [id number] Attending MD: Tresea Mall Dr., MD Date of Birth: [birth year] CSN: [id number] Age: 27 Admit Type: Inpatient Procedure:                Flexible Sigmoidoscopy Indications:              Hematochezia, Diarrhea (presumed secondary to                            ulcerative colitis), Diarrhea (secondary to                            noninfectious colitis), Abnormal CT of the GI tract Providers:                Fayrene Fearing L. Wyeth Hoffer Dr., MD, Dwain Sarna, RN, Verita Schneiders, Technician Referring MD:              Medicines:                Fentanyl 75 micrograms IV, Midazolam 5 mg IV Complications:            No immediate complications. Estimated Blood Loss:     Estimated blood loss was minimal. Procedure:                Pre-Anesthesia Assessment:                           - Prior to the procedure, a History and Physical                            was performed, and patient medications and                            allergies were reviewed. The patient's tolerance of                            previous anesthesia was also reviewed. The risks                            and benefits of the procedure and the sedation                            options and risks were discussed with the patient.                            All questions were answered, and informed consent                            was obtained. Prior Anticoagulants: The patient has                            taken no previous anticoagulant or antiplatelet  agents. ASA Grade Assessment: I - A normal, healthy                            patient. After reviewing the risks and benefits,                            the patient was deemed in satisfactory condition to                            undergo the procedure.                           After obtaining informed consent, the scope was                     passed under direct vision. The [id number]-[id number]                            ([id number]) Olympus peds colonscope was introduced                            through the anus and advanced to the the sigmoid                            colon. The flexible sigmoidoscopy was accomplished                            without difficulty. The patient tolerated the                            procedure well. Scope In: Scope Out: Findings:      The perianal and digital rectal examinations were normal.      A continuous area of bleeding ulcerated mucosa with stigmata of recent       bleeding was present in the recto-sigmoid colon. Biopsies were taken       with a cold forceps for histology. There was continuous ulceration with       deep confluent ulcers from the rectum to approximately 45 cm. The mucosa       was friable. Biopsies were taken for histology and were taken for viral       culture as well. Impression:               - Mucosal ulceration. Biopsied. Viral culture sent.                            Consistent with severe ulcerative colitis Moderate Sedation:      Moderate (conscious) sedation was administered by the endoscopy nurse       and supervised by the endoscopist. The patient's oxygen saturation,       heart rate, blood pressure and response to care were monitored. Recommendation:           - Return patient to hospital ward for ongoing care.                           - Solu-Medrol 80 mg twice daily                           -  Clear liquid diet. Procedure Code(s):        --- Professional ---                           [phone], Sigmoidoscopy, flexible; with biopsy, single                            or multiple Diagnosis Code(s):        --- Professional ---                           K63.3, Ulcer of intestine                           K92.1, Melena (includes Hematochezia)                           R19.7, Diarrhea, unspecified                           K52.9, Noninfective  gastroenteritis and colitis,                            unspecified                           R93.3, Abnormal findings on diagnostic imaging of                            other parts of digestive tract CPT copyright 2018 American [employer] Association. All rights reserved. The codes documented in this report are preliminary and upon coder review may  be revised to meet current compliance requirements. Tresea MallJames L Livie Vanderhoof Dr., MD 05/14/2018 12:33:35 PM This report has been signed electronically. Number of Addenda: 0

## 2018-05-15 NOTE — Progress Notes (Signed)
PROGRESS NOTE    Joann Robinson  OZH:[id number] DOB: [birth year] DOA: 05/12/2018 PCP: Patient, No Pcp Per    Brief Narrative: 27 year old with no significant past medical history, who presented with 2 weeks history of bloody diarrhea, accompanied with cramping abdominal pain. Evaluation in the emergency department show a hemoglobin at 6, occult blood positive.  CT abdomen and pelvis showed diffuse colitis. GI pathogen negative C. difficile negative.  GI was consulted patient underwent flexible sigmoidoscopy 05/14/2018 which showed severe colitis.  I discussed case with Dr. Randa Evens who recommended to start IV antibiotics and IV Solu-Medrol.    Assessment & Plan:   Principal Problem:   Symptomatic anemia Active Problems:   Colitis   Diarrhea   Hypokalemia   Severe colitis; Continue with IV fluids Started on IV Solu-Medrol with concern for inflammatory bowel disease. Discussed with GI recommends IV ciprofloxacin and Flagyl. Stool culture viral culture ordered. Patient reports some improvement of diarrhea. Plan is to continue with IV Solu-Medrol and IV antibiotics.  Await pathology report. Still with significant leukocytosis  Anemia; acute blood loss anemia and iron deficiency anemia She has received 3 units during this admission Hb improved.   Tachycardia; IV fluids, IV bolus.  Blood transfusion. Improve  Hypokalemia ; Resolved  RN Pressure Injury Documentation:    Malnutrition Type:      Malnutrition Characteristics:      Nutrition Interventions:     Estimated body mass index is 32.22 kg/m as calculated from the following:   Height as of this encounter: 5\' 6"  (1.676 m).   Weight as of this encounter: 90.5 kg.   DVT prophylaxis: SCDs, no anticoagulation due to GI bleed Code Status: Full code Family Communication: Discussed with mother who was at bedside Disposition Plan: Remain in the hospital for IV fluids, IV antibiotics, blood  transfusion.  Consultants:   Dr. Randa Evens   Procedures:    sigmoidoscopy 05/14/2018   Antimicrobials:   Ciprofloxacin and Flagyl 05/14/2018   Subjective: She reports improvement of abdominal pain, improvement of diarrhea.  She has had 2 watery stools today so far.   Objective: Vitals:   05/14/18 2142 05/15/18 0529 05/15/18 0535 05/15/18 1257  BP: (!) 128/91 (!) 138/103 121/81 (!) 125/95  Pulse: (!) 105 95 94 (!) 104  Resp:  12    Temp:  98.3 F (36.8 C)  99.4 F (37.4 C)  TempSrc:  Oral  Oral  SpO2:  99% 100%   Weight:      Height:        Intake/Output Summary (Last 24 hours) at 05/15/2018 1650 Last data filed at 05/15/2018 0600 Gross per 24 hour  Intake 1923.83 ml  Output -  Net 1923.83 ml   Filed Weights   05/12/18 1823 05/13/18 0015  Weight: 90.3 kg 90.5 kg    Examination:  General exam: Not acute distress, Respiratory system: Clear to auscultation Cardiovascular system: S1, S2 regular rhythm and rate Gastrointestinal system: Bowel sounds present, soft , mild tender Central nervous system: Nonfocal Extremities: Symmeetric power Skin: No rashes   Data Reviewed: I have personally reviewed following labs and imaging studies  CBC: Recent Labs  Lab 05/12/18 1812 05/13/18 0838 05/14/18 0544 05/15/18 0612  WBC 21.5* 22.8* 23.6* 20.7*  NEUTROABS 16.2* 16.6* 20.1*  --   HGB 6.2* 8.5* 7.1* 9.4*  HCT 20.9* 27.1* 22.9* 29.7*  MCV 70.1* 76.3* 77.4* 82.0  PLT 469* 381 359 458*   Basic Metabolic Panel: Recent Labs  Lab 05/12/18 1811 05/12/18 1812 05/13/18  [id number] 05/13/18 1605 05/14/18 0544 05/15/18 0612  NA 125*  --  128* 129* 130* 133*  K 2.4*  --  2.6* 2.8* 3.4* 4.4  CL 83*  --  91* 92* 98 102  CO2 29  --  24 21* 20* 20*  GLUCOSE 149*  --  126* 119* 108* 160*  BUN 9  --  8 7 7 7   CREATININE 0.70  --  0.67 0.60 0.60 0.59  CALCIUM 7.9*  --  7.7* 7.7* 7.6* 8.1*  MG  --  1.8 2.3  --  1.9  --    GFR: Estimated Creatinine Clearance: 121.8 mL/min  (by C-G formula based on SCr of 0.59 mg/dL). Liver Function Tests: Recent Labs  Lab 05/12/18 1811 05/13/18 0838  AST 13* 13*  ALT 8 10  ALKPHOS 56 49  BILITOT 0.8 1.5*  PROT 6.4* 5.6*  ALBUMIN 2.4* 2.3*   Recent Labs  Lab 05/12/18 1811  LIPASE 80*   No results for input(s): AMMONIA in the last 168 hours. Coagulation Profile: No results for input(s): INR, PROTIME in the last 168 hours. Cardiac Enzymes: No results for input(s): CKTOTAL, CKMB, CKMBINDEX, TROPONINI in the last 168 hours. BNP (last 3 results) No results for input(s): PROBNP in the last 8760 hours. HbA1C: No results for input(s): HGBA1C in the last 72 hours. CBG: No results for input(s): GLUCAP in the last 168 hours. Lipid Profile: No results for input(s): CHOL, HDL, LDLCALC, TRIG, CHOLHDL, LDLDIRECT in the last 72 hours. Thyroid Function Tests: Recent Labs    05/14/18 0544  TSH 2.249   Anemia Panel: Recent Labs    05/12/18 2017 05/12/18 2121  VITAMINB12 815  --   FOLATE 7.5  --   FERRITIN 42  --   TIBC 209*  --   IRON 17*  --   RETICCTPCT  --  2.5   Sepsis Labs: Recent Labs  Lab 05/13/18 [id number]  LATICACIDVEN 1.0    Recent Results (from the past 240 hour(s))  Gastrointestinal Panel by PCR , Stool     Status: None   Collection Time: 05/12/18  7:33 PM  Result Value Ref Range Status   Campylobacter species NOT DETECTED NOT DETECTED Final   Plesimonas shigelloides NOT DETECTED NOT DETECTED Final   Salmonella species NOT DETECTED NOT DETECTED Final   Yersinia enterocolitica NOT DETECTED NOT DETECTED Final   Vibrio species NOT DETECTED NOT DETECTED Final   Vibrio cholerae NOT DETECTED NOT DETECTED Final   Enteroaggregative E coli (EAEC) NOT DETECTED NOT DETECTED Final   Enteropathogenic E coli (EPEC) NOT DETECTED NOT DETECTED Final   Enterotoxigenic E coli (ETEC) NOT DETECTED NOT DETECTED Final   Shiga like toxin producing E coli (STEC) NOT DETECTED NOT DETECTED Final   Shigella/Enteroinvasive  E coli (EIEC) NOT DETECTED NOT DETECTED Final   Cryptosporidium NOT DETECTED NOT DETECTED Final   Cyclospora cayetanensis NOT DETECTED NOT DETECTED Final   Entamoeba histolytica NOT DETECTED NOT DETECTED Final   Giardia lamblia NOT DETECTED NOT DETECTED Final   Adenovirus F40/41 NOT DETECTED NOT DETECTED Final   Astrovirus NOT DETECTED NOT DETECTED Final   Norovirus GI/GII NOT DETECTED NOT DETECTED Final   Rotavirus A NOT DETECTED NOT DETECTED Final   Sapovirus (I, II, IV, and V) NOT DETECTED NOT DETECTED Final    Comment: Performed at [facility], [address] [place], [place] [address]  C difficile quick scan w PCR reflex     Status: None   Collection Time:  05/12/18  7:33 PM  Result Value Ref Range Status   C Diff antigen NEGATIVE NEGATIVE Final   C Diff toxin NEGATIVE NEGATIVE Final   C Diff interpretation No C. difficile detected.  Final    Comment: Performed at [facility], [address], [place], [place] [address]         Radiology Studies: No results found.      Scheduled Meds: . methylPREDNISolone (SOLU-MEDROL) injection  80 mg Intravenous Q12H   Continuous Infusions: . 0.9 % NaCl with KCl 20 mEq / L Stopped (05/15/18 0542)  . ciprofloxacin Stopped (05/15/18 0231)  . metronidazole 500 mg (05/15/18 1400)     LOS: 2 days    Time spent: 35 minutes     Alba Cory, MD [facility] Pager [phone] If 7PM-7AM, please contact night-coverage [email or web address] Password [email or web address] 05/15/2018, 4:50 PM

## 2018-05-16 LAB — BASIC METABOLIC PANEL
Anion gap: 8 (ref 5–15)
BUN: 8 mg/dL (ref 6–20)
CALCIUM: 8 mg/dL — AB (ref 8.9–10.3)
CO2: 21 mmol/L — AB (ref 22–32)
Chloride: 105 mmol/L (ref 98–111)
Creatinine, Ser: 0.57 mg/dL (ref 0.44–1.00)
GFR calc Af Amer: >60 mL/min (ref >60)
GFR calc non Af Amer: >60 mL/min (ref >60)
Glucose, Bld: 163 mg/dL — ABNORMAL HIGH (ref 70–99)
Potassium: 4.7 mmol/L (ref 3.5–5.1)
Sodium: 134 mmol/L — ABNORMAL LOW (ref 135–145)

## 2018-05-16 LAB — CBC
HCT: 27.5 % — ABNORMAL LOW (ref 36.0–46.0)
Hemoglobin: 8.8 g/dL — ABNORMAL LOW (ref 12.0–15.0)
MCH: 26.1 pg (ref 26.0–34.0)
MCHC: 32 g/dL (ref 30.0–36.0)
MCV: 81.6 fL (ref 80.0–100.0)
Platelets: 396 10*3/uL (ref 150–400)
RBC: 3.37 MIL/uL — ABNORMAL LOW (ref 3.87–5.11)
RDW: 23.9 % — ABNORMAL HIGH (ref 11.5–15.5)
WBC: 16.1 10*3/uL — ABNORMAL HIGH (ref 4.0–10.5)
nRBC: 0.4 % — ABNORMAL HIGH (ref 0.0–0.2)

## 2018-05-16 MED ORDER — FERROUS SULFATE 325 (65 FE) MG PO TABS
325.0000 mg | ORAL_TABLET | Freq: Two times a day (BID) | ORAL | Status: DC
  Administered 2018-05-16 – 2018-05-19 (×6): 325 mg via ORAL
  Filled 2018-05-16 (×7): qty 1

## 2018-05-16 NOTE — Progress Notes (Signed)
[facility] PROGRESS NOTE Subjective Patient states that her diarrhea is much better.  Path is returned showing chronic colitis consistent with IBD no granulomas  Objective: Vital signs in last 24 hours: Temp:  [98 F (36.7 C)-99.4 F (37.4 C)] 98 F (36.7 C) (12/06 0551) Pulse Rate:  [80-104] 80 (12/06 0551) Resp:  [18] 18 (12/06 0551) BP: (108-125)/(77-95) 108/77 (12/06 0551) SpO2:  [100 %] 100 % (12/06 0551) Last BM Date: 05/14/18  Intake/Output from previous day: 12/05 0701 - 12/06 0700 In: 2924.1 [I.V.:1730.8; IV Piggyback:1193.3] Out: -  Intake/Output this shift: Total I/O In: 240 [P.O.:240] Out: -   PE: General--patient sitting on bedside toilet no obvious distress   Lab Results: Recent Labs    05/14/18 0544 05/15/18 0612 05/16/18 0644  WBC 23.6* 20.7* 16.1*  HGB 7.1* 9.4* 8.8*  HCT 22.9* 29.7* 27.5*  PLT 359 458* 396   BMET Recent Labs    05/13/18 1605 05/14/18 0544 05/15/18 0612 05/16/18 0644  NA 129* 130* 133* 134*  K 2.8* 3.4* 4.4 4.7  CL 92* 98 102 105  CO2 21* 20* 20* 21*  CREATININE 0.60 0.60 0.59 0.57   LFT No results for input(s): PROT, AST, ALT, ALKPHOS, BILITOT, BILIDIR, IBILI in the last 72 hours. PT/INR No results for input(s): LABPROT, INR in the last 72 hours. PANCREAS No results for input(s): LIPASE in the last 72 hours.       Studies/Results: No results found.  Medications: I have reviewed the patient's current medications.  Assessment:   1.  Chronic colitis.  This is quite severe and suspect this is initial presentation of ulcerative colitis.  She has a number of confluent ulcers but seems to be better with just a few days of IV Solu-Medrol   Plan: 1.  Would continue at least 1 more day of IV Solu-Medrol and IV antibiotics.  If continues to do well we could change her to p.o. medicines and hopefully be able to discharge her soon. 2.  We will go ahead and advance to full liquid diet    Tresea MallJames L  Phong Isenberg 05/16/2018, 10:13 AM  This note was created using voice recognition software. Minor errors may Have occurred unintentionally.  Pager: [phone] If no answer or after hours call [phone]

## 2018-05-16 NOTE — Progress Notes (Signed)
PROGRESS NOTE    Joann Robinson  ZHY:[id number] DOB: 08-13-1990 DOA: 05/12/2018 PCP: Patient, No Pcp Per    Brief Narrative: 27 year old with no significant past medical history, who presented with 2 weeks history of bloody diarrhea, accompanied with cramping abdominal pain. Evaluation in the emergency department show a hemoglobin at 6, occult blood positive.  CT abdomen and pelvis showed diffuse colitis. GI pathogen negative C. difficile negative.  GI was consulted patient underwent flexible sigmoidoscopy 05/14/2018 which showed severe colitis.  I discussed case with Dr. Randa Evens who recommended to start IV antibiotics and IV Solu-Medrol.    Assessment & Plan:   Principal Problem:   Symptomatic anemia Active Problems:   Colitis   Diarrhea   Hypokalemia   Severe colitis; ulcerative colitis.  Continue with IV fluids Started on IV Solu-Medrol with concern for inflammatory bowel disease. Discussed with GI recommends IV ciprofloxacin and Flagyl. Stool culture viral culture ordered. No virus isolated.  E coli negative.  Patient reports some improvement of diarrhea. Plan is to continue with IV Solu-Medrol and IV antibiotics. Pathology consistent with chronic colitis.  Improving.  Per GI , continue with IV solumedrol for few days.  Advance diet today.   Anemia; acute blood loss anemia and iron deficiency anemia She has received 3 units during this admission Hb stable  Will need to start ferrous sulfate.   Tachycardia; IV fluids, IV bolus.  Blood transfusion. Improve  Hyponatremia; continue with IV fluids.   Hypokalemia ; Resolved  RN Pressure Injury Documentation:    Malnutrition Type:      Malnutrition Characteristics:      Nutrition Interventions:     Estimated body mass index is 32.22 kg/m as calculated from the following:   Height as of this encounter: 5\' 6"  (1.676 m).   Weight as of this encounter: 90.5 kg.   DVT prophylaxis: SCDs, no anticoagulation due  to GI bleed Code Status: Full code Family Communication: Discussed with mother who was at bedside Disposition Plan: Remain in the hospital for IV fluids, IV antibiotics, blood transfusion.  Consultants:   Dr. Randa Evens   Procedures:    sigmoidoscopy 05/14/2018   Antimicrobials:   Ciprofloxacin and Flagyl 05/14/2018   Subjective: Report improvement of diarrhea and abdominal pain.  Less bloody stool.   Objective: Vitals:   05/15/18 0535 05/15/18 1257 05/16/18 0551 05/16/18 1535  BP: 121/81 (!) 125/95 108/77 (!) 120/91  Pulse: 94 (!) 104 80 98  Resp:   18 16  Temp:  99.4 F (37.4 C) 98 F (36.7 C) 98.7 F (37.1 C)  TempSrc:  Oral Oral Oral  SpO2: 100%  100% 100%  Weight:      Height:        Intake/Output Summary (Last 24 hours) at 05/16/2018 1640 Last data filed at 05/16/2018 1250 Gross per 24 hour  Intake 3404.11 ml  Output -  Net 3404.11 ml   Filed Weights   05/12/18 1823 05/13/18 0015  Weight: 90.3 kg 90.5 kg    Examination:  General exam: NAD Respiratory system: CTA Cardiovascular system: S 1, S 2 RRR Gastrointestinal system: BS present, soft, nt Central nervous system: non focal.  Extremities: symmetric power.  Skin: No rashes   Data Reviewed: I have personally reviewed following labs and imaging studies  CBC: Recent Labs  Lab 05/12/18 1812 05/13/18 0838 05/14/18 0544 05/15/18 0612 05/16/18 0644  WBC 21.5* 22.8* 23.6* 20.7* 16.1*  NEUTROABS 16.2* 16.6* 20.1*  --   --   HGB 6.2* 8.5*  7.1* 9.4* 8.8*  HCT 20.9* 27.1* 22.9* 29.7* 27.5*  MCV 70.1* 76.3* 77.4* 82.0 81.6  PLT 469* 381 359 458* 396   Basic Metabolic Panel: Recent Labs  Lab 05/12/18 1812 05/13/18 0838 05/13/18 1605 05/14/18 0544 05/15/18 0612 05/16/18 0644  NA  --  128* 129* 130* 133* 134*  K  --  2.6* 2.8* 3.4* 4.4 4.7  CL  --  91* 92* 98 102 105  CO2  --  24 21* 20* 20* 21*  GLUCOSE  --  126* 119* 108* 160* 163*  BUN  --  8 7 7 7 8   CREATININE  --  0.67 0.60 0.60  0.59 0.57  CALCIUM  --  7.7* 7.7* 7.6* 8.1* 8.0*  MG 1.8 2.3  --  1.9  --   --    GFR: Estimated Creatinine Clearance: 121.8 mL/min (by C-G formula based on SCr of 0.57 mg/dL). Liver Function Tests: Recent Labs  Lab 05/12/18 1811 05/13/18 0838  AST 13* 13*  ALT 8 10  ALKPHOS 56 49  BILITOT 0.8 1.5*  PROT 6.4* 5.6*  ALBUMIN 2.4* 2.3*   Recent Labs  Lab 05/12/18 1811  LIPASE 80*   No results for input(s): AMMONIA in the last 168 hours. Coagulation Profile: No results for input(s): INR, PROTIME in the last 168 hours. Cardiac Enzymes: No results for input(s): CKTOTAL, CKMB, CKMBINDEX, TROPONINI in the last 168 hours. BNP (last 3 results) No results for input(s): PROBNP in the last 8760 hours. HbA1C: No results for input(s): HGBA1C in the last 72 hours. CBG: No results for input(s): GLUCAP in the last 168 hours. Lipid Profile: No results for input(s): CHOL, HDL, LDLCALC, TRIG, CHOLHDL, LDLDIRECT in the last 72 hours. Thyroid Function Tests: Recent Labs    05/14/18 0544  TSH 2.249   Anemia Panel: No results for input(s): VITAMINB12, FOLATE, FERRITIN, TIBC, IRON, RETICCTPCT in the last 72 hours. Sepsis Labs: Recent Labs  Lab 05/13/18 1610  LATICACIDVEN 1.0    Recent Results (from the past 240 hour(s))  Gastrointestinal Panel by PCR , Stool     Status: None   Collection Time: 05/12/18  7:33 PM  Result Value Ref Range Status   Campylobacter species NOT DETECTED NOT DETECTED Final   Plesimonas shigelloides NOT DETECTED NOT DETECTED Final   Salmonella species NOT DETECTED NOT DETECTED Final   Yersinia enterocolitica NOT DETECTED NOT DETECTED Final   Vibrio species NOT DETECTED NOT DETECTED Final   Vibrio cholerae NOT DETECTED NOT DETECTED Final   Enteroaggregative E coli (EAEC) NOT DETECTED NOT DETECTED Final   Enteropathogenic E coli (EPEC) NOT DETECTED NOT DETECTED Final   Enterotoxigenic E coli (ETEC) NOT DETECTED NOT DETECTED Final   Shiga like toxin producing  E coli (STEC) NOT DETECTED NOT DETECTED Final   Shigella/Enteroinvasive E coli (EIEC) NOT DETECTED NOT DETECTED Final   Cryptosporidium NOT DETECTED NOT DETECTED Final   Cyclospora cayetanensis NOT DETECTED NOT DETECTED Final   Entamoeba histolytica NOT DETECTED NOT DETECTED Final   Giardia lamblia NOT DETECTED NOT DETECTED Final   Adenovirus F40/41 NOT DETECTED NOT DETECTED Final   Astrovirus NOT DETECTED NOT DETECTED Final   Norovirus GI/GII NOT DETECTED NOT DETECTED Final   Rotavirus A NOT DETECTED NOT DETECTED Final   Sapovirus (I, II, IV, and V) NOT DETECTED NOT DETECTED Final    Comment: Performed at [facility], [address] [place], [place] [address]  C difficile quick scan w PCR reflex  Status: None   Collection Time: 05/12/18  7:33 PM  Result Value Ref Range Status   C Diff antigen NEGATIVE NEGATIVE Final   C Diff toxin NEGATIVE NEGATIVE Final   C Diff interpretation No C. difficile detected.  Final    Comment: Performed at [facility], [address] [place], [place] [address]  Virus culture     Status: None   Collection Time: 05/14/18 12:47 PM  Result Value Ref Range Status   Viral Culture Comment  Final    Comment: (NOTE) Preliminary Report: No virus isolated at 24 hours. Next report to follow after 4 days. Performed At: [facility] [address] [place], [place] [id number] Jolene SchimkeNagendra Sanjai MD GM:[id number]:[phone]    Source of Sample PERIRECTAL  Final    Comment: SIGMOID COLON BIOPSY R/O CMV PATIENT ON CIPRO Performed at [facility], [address] [place], [place] [address]   Stool culture (children & immunocomp patients)     Status: None (Preliminary result)   Collection Time: 05/14/18  1:06 PM  Result Value Ref Range Status   Salmonella/Shigella Screen Final report  Final   Campylobacter Culture PENDING  Incomplete   E coli, Shiga toxin Assay Negative Negative Final    Comment: (NOTE) Performed At: [facility] [address] [place], [place] [id number] Jolene SchimkeNagendra Sanjai MD VF:[id number]:[phone]   STOOL CULTURE REFLEX - RSASHR     Status: None   Collection Time: 05/14/18  1:06 PM  Result Value Ref Range Status   Stool Culture result 1 (RSASHR) Comment  Final    Comment: (NOTE) No Salmonella or Shigella recovered. Performed At: [facility] [place] [address] [place], [place] [id number] Jolene SchimkeNagendra Sanjai MD SW:[id number]:[phone]          Radiology Studies: No results found.      Scheduled Meds: . methylPREDNISolone (SOLU-MEDROL) injection  80 mg Intravenous Q12H   Continuous Infusions: . sodium chloride 100 mL/hr at 05/16/18 0607  . ciprofloxacin 400 mg (05/16/18 1505)  . metronidazole Stopped (05/16/18 1503)     LOS: 3 days    Time spent: 35 minutes     Alba CoryBelkys A Regalado, MD [facility] Pager [phone] If 7PM-7AM, please contact night-coverage [email or web address] Password [email or web address] 05/16/2018, 4:40 PM

## 2018-05-17 LAB — BASIC METABOLIC PANEL
Anion gap: 7 (ref 5–15)
BUN: 8 mg/dL (ref 6–20)
CO2: 22 mmol/L (ref 22–32)
Calcium: 8.1 mg/dL — ABNORMAL LOW (ref 8.9–10.3)
Chloride: 106 mmol/L (ref 98–111)
Creatinine, Ser: 0.57 mg/dL (ref 0.44–1.00)
GFR calc Af Amer: >60 mL/min (ref >60)
GFR calc non Af Amer: >60 mL/min (ref >60)
Glucose, Bld: 144 mg/dL — ABNORMAL HIGH (ref 70–99)
POTASSIUM: 4.6 mmol/L (ref 3.5–5.1)
Sodium: 135 mmol/L (ref 135–145)

## 2018-05-17 LAB — CBC
HCT: 30.4 % — ABNORMAL LOW (ref 36.0–46.0)
Hemoglobin: 9.2 g/dL — ABNORMAL LOW (ref 12.0–15.0)
MCH: 26 pg (ref 26.0–34.0)
MCHC: 30.3 g/dL (ref 30.0–36.0)
MCV: 85.9 fL (ref 80.0–100.0)
Platelets: 515 10*3/uL — ABNORMAL HIGH (ref 150–400)
RBC: 3.54 MIL/uL — AB (ref 3.87–5.11)
RDW: 24.7 % — ABNORMAL HIGH (ref 11.5–15.5)
WBC: 20.3 10*3/uL — ABNORMAL HIGH (ref 4.0–10.5)
nRBC: 0.3 % — ABNORMAL HIGH (ref 0.0–0.2)

## 2018-05-17 MED ORDER — METRONIDAZOLE 500 MG PO TABS
500.0000 mg | ORAL_TABLET | Freq: Three times a day (TID) | ORAL | Status: DC
  Administered 2018-05-17 – 2018-05-19 (×6): 500 mg via ORAL
  Filled 2018-05-17 (×6): qty 1

## 2018-05-17 MED ORDER — PREDNISONE 20 MG PO TABS
20.0000 mg | ORAL_TABLET | Freq: Two times a day (BID) | ORAL | Status: DC
  Administered 2018-05-17 – 2018-05-19 (×4): 20 mg via ORAL
  Filled 2018-05-17 (×5): qty 1

## 2018-05-17 MED ORDER — CIPROFLOXACIN HCL 500 MG PO TABS
500.0000 mg | ORAL_TABLET | Freq: Two times a day (BID) | ORAL | Status: DC
  Administered 2018-05-17 – 2018-05-19 (×4): 500 mg via ORAL
  Filled 2018-05-17 (×4): qty 1

## 2018-05-17 NOTE — Progress Notes (Signed)
PROGRESS NOTE    Joann Robinson  WGN:[id number] DOB: December 21, 1990 DOA: 05/12/2018 PCP: Patient, No Pcp Per    Brief Narrative: 27 year old with no significant past medical history, who presented with 2 weeks history of bloody diarrhea, accompanied with cramping abdominal pain. Evaluation in the emergency department show a hemoglobin at 6, occult blood positive.  CT abdomen and pelvis showed diffuse colitis. GI pathogen negative C. difficile negative.  GI was consulted patient underwent flexible sigmoidoscopy 05/14/2018 which showed severe colitis.  I discussed case with Dr. Randa Evens who recommended to start IV antibiotics and IV Solu-Medrol.    Assessment & Plan:   Principal Problem:   Symptomatic anemia Active Problems:   Colitis   Diarrhea   Hypokalemia   Severe colitis; ulcerative colitis.  Continue with IV fluids Started on IV Solu-Medrol with concern for inflammatory bowel disease. Discussed with GI recommends IV ciprofloxacin and Flagyl. Stool culture viral culture ordered. No virus isolated.  E coli negative.  Patient was treated with IV Solu-Medrol and IV antibiotics. Pathology consistent with chronic colitis.  Plan to change to oral prednisone. Advance diet today  Anemia; acute blood loss anemia and iron deficiency anemia She has received 3 units during this admission Hb stable  Will need to start ferrous sulfate.   Tachycardia; IV fluids, IV bolus.  Blood transfusion. Improve  Hyponatremia; continue with IV fluids.   Hypokalemia ; Resolved  RN Pressure Injury Documentation:    Malnutrition Type:      Malnutrition Characteristics:      Nutrition Interventions:     Estimated body mass index is 32.22 kg/m as calculated from the following:   Height as of this encounter: 5\' 6"  (1.676 m).   Weight as of this encounter: 90.5 kg.   DVT prophylaxis: SCDs, no anticoagulation due to GI bleed Code Status: Full code Family Communication: Discussed with  mother who was at bedside Disposition Plan: Remain in the hospital for IV fluids, IV antibiotics, blood transfusion.  Consultants:   Dr. Randa Evens   Procedures:    sigmoidoscopy 05/14/2018   Antimicrobials:   Ciprofloxacin and Flagyl 05/14/2018   Subjective: She is feeling better denies abdominal pain.  Diarrhea has improved.  Is not bloody anymore.  Objective: Vitals:   05/16/18 1535 05/16/18 2039 05/17/18 0631 05/17/18 1306  BP: (!) 120/91 120/87 112/83 (!) 148/102  Pulse: 98 98 78 100  Resp: 16 16 12 18   Temp: 98.7 F (37.1 C) 98.3 F (36.8 C) 98.6 F (37 C) 98.4 F (36.9 C)  TempSrc: Oral Oral Oral Oral  SpO2: 100% 98% 100% 100%  Weight:      Height:        Intake/Output Summary (Last 24 hours) at 05/17/2018 1600 Last data filed at 05/16/2018 1750 Gross per 24 hour  Intake 240 ml  Output -  Net 240 ml   Filed Weights   05/12/18 1823 05/13/18 0015  Weight: 90.3 kg 90.5 kg    Examination:  General exam: Not acute distress Respiratory system: Clear to auscultation Cardiovascular system: S1-S2 regular rate Gastrointestinal system: Bowel sounds present, mildly distended, soft mild tenderness Central nervous system: Nonfocal Extremities: Symmetric power Skin: No rashes   Data Reviewed: I have personally reviewed following labs and imaging studies  CBC: Recent Labs  Lab 05/12/18 1812 05/13/18 0838 05/14/18 0544 05/15/18 0612 05/16/18 0644 05/17/18 0532  WBC 21.5* 22.8* 23.6* 20.7* 16.1* 20.3*  NEUTROABS 16.2* 16.6* 20.1*  --   --   --   HGB 6.2* 8.5*  7.1* 9.4* 8.8* 9.2*  HCT 20.9* 27.1* 22.9* 29.7* 27.5* 30.4*  MCV 70.1* 76.3* 77.4* 82.0 81.6 85.9  PLT 469* 381 359 458* 396 515*   Basic Metabolic Panel: Recent Labs  Lab 05/12/18 1812 05/13/18 0838 05/13/18 1605 05/14/18 0544 05/15/18 0612 05/16/18 0644 05/17/18 0532  NA  --  128* 129* 130* 133* 134* 135  K  --  2.6* 2.8* 3.4* 4.4 4.7 4.6  CL  --  91* 92* 98 102 105 106  CO2  --  24  21* 20* 20* 21* 22  GLUCOSE  --  126* 119* 108* 160* 163* 144*  BUN  --  8 7 7 7 8 8   CREATININE  --  0.67 0.60 0.60 0.59 0.57 0.57  CALCIUM  --  7.7* 7.7* 7.6* 8.1* 8.0* 8.1*  MG 1.8 2.3  --  1.9  --   --   --    GFR: Estimated Creatinine Clearance: 121.8 mL/min (by C-G formula based on SCr of 0.57 mg/dL). Liver Function Tests: Recent Labs  Lab 05/12/18 1811 05/13/18 0838  AST 13* 13*  ALT 8 10  ALKPHOS 56 49  BILITOT 0.8 1.5*  PROT 6.4* 5.6*  ALBUMIN 2.4* 2.3*   Recent Labs  Lab 05/12/18 1811  LIPASE 80*   No results for input(s): AMMONIA in the last 168 hours. Coagulation Profile: No results for input(s): INR, PROTIME in the last 168 hours. Cardiac Enzymes: No results for input(s): CKTOTAL, CKMB, CKMBINDEX, TROPONINI in the last 168 hours. BNP (last 3 results) No results for input(s): PROBNP in the last 8760 hours. HbA1C: No results for input(s): HGBA1C in the last 72 hours. CBG: No results for input(s): GLUCAP in the last 168 hours. Lipid Profile: No results for input(s): CHOL, HDL, LDLCALC, TRIG, CHOLHDL, LDLDIRECT in the last 72 hours. Thyroid Function Tests: No results for input(s): TSH, T4TOTAL, FREET4, T3FREE, THYROIDAB in the last 72 hours. Anemia Panel: No results for input(s): VITAMINB12, FOLATE, FERRITIN, TIBC, IRON, RETICCTPCT in the last 72 hours. Sepsis Labs: Recent Labs  Lab 05/13/18 1610  LATICACIDVEN 1.0    Recent Results (from the past 240 hour(s))  Gastrointestinal Panel by PCR , Stool     Status: None   Collection Time: 05/12/18  7:33 PM  Result Value Ref Range Status   Campylobacter species NOT DETECTED NOT DETECTED Final   Plesimonas shigelloides NOT DETECTED NOT DETECTED Final   Salmonella species NOT DETECTED NOT DETECTED Final   Yersinia enterocolitica NOT DETECTED NOT DETECTED Final   Vibrio species NOT DETECTED NOT DETECTED Final   Vibrio cholerae NOT DETECTED NOT DETECTED Final   Enteroaggregative E coli (EAEC) NOT DETECTED NOT  DETECTED Final   Enteropathogenic E coli (EPEC) NOT DETECTED NOT DETECTED Final   Enterotoxigenic E coli (ETEC) NOT DETECTED NOT DETECTED Final   Shiga like toxin producing E coli (STEC) NOT DETECTED NOT DETECTED Final   Shigella/Enteroinvasive E coli (EIEC) NOT DETECTED NOT DETECTED Final   Cryptosporidium NOT DETECTED NOT DETECTED Final   Cyclospora cayetanensis NOT DETECTED NOT DETECTED Final   Entamoeba histolytica NOT DETECTED NOT DETECTED Final   Giardia lamblia NOT DETECTED NOT DETECTED Final   Adenovirus F40/41 NOT DETECTED NOT DETECTED Final   Astrovirus NOT DETECTED NOT DETECTED Final   Norovirus GI/GII NOT DETECTED NOT DETECTED Final   Rotavirus A NOT DETECTED NOT DETECTED Final   Sapovirus (I, II, IV, and V) NOT DETECTED NOT DETECTED Final    Comment: Performed at [facility],  [address] [place], [place] [address]  C difficile quick scan w PCR reflex     Status: None   Collection Time: 05/12/18  7:33 PM  Result Value Ref Range Status   C Diff antigen NEGATIVE NEGATIVE Final   C Diff toxin NEGATIVE NEGATIVE Final   C Diff interpretation No C. difficile detected.  Final    Comment: Performed at [facility], [address] [place], [place] [address]  Virus culture     Status: None   Collection Time: 05/14/18 12:47 PM  Result Value Ref Range Status   Viral Culture Comment  Final    Comment: (NOTE) Preliminary Report: No virus isolated at 24 hours. Next report to follow after 4 days. Performed At: [facility] [place] [address] [place], [place] [id number] Jolene SchimkeNagendra Sanjai MD WG:[id number]    Source of Sample PERIRECTAL  Final    Comment: SIGMOID COLON BIOPSY R/O CMV PATIENT ON CIPRO Performed at [facility], [address] [place], [place] [address]   Stool culture (children & immunocomp patients)     Status: None (Preliminary result)   Collection Time: 05/14/18  1:06 PM  Result Value Ref Range Status    Salmonella/Shigella Screen Final report  Final   Campylobacter Culture PENDING  Incomplete   E coli, Shiga toxin Assay Negative Negative Final    Comment: (NOTE) Performed At: [facility] [place] [address] [place], [place] [id number] Jolene SchimkeNagendra Sanjai MD KG:[id number]   STOOL CULTURE REFLEX - RSASHR     Status: None   Collection Time: 05/14/18  1:06 PM  Result Value Ref Range Status   Stool Culture result 1 (RSASHR) Comment  Final    Comment: (NOTE) No Salmonella or Shigella recovered. Performed At: [facility] [place] [address] [place], [place] [id number] Jolene SchimkeNagendra Sanjai MD VZ:[id number]          Radiology Studies: No results found.      Scheduled Meds: . ciprofloxacin  500 mg Oral BID  . ferrous sulfate  325 mg Oral BID WC  . metroNIDAZOLE  500 mg Oral Q8H  . predniSONE  20 mg Oral BID WC   Continuous Infusions: . sodium chloride 100 mL/hr at 05/17/18 0524     LOS: 4 days    Time spent: 35 minutes     Joann CoryBelkys A Regalado, MD [facility] Pager [phone] If 7PM-7AM, please contact night-coverage [email or web address] Password [email or web address] 05/17/2018, 4:00 PM

## 2018-05-17 NOTE — Progress Notes (Signed)
[facility] PROGRESS NOTE Subjective Patient states that her bowel movements are much better no blood no fever  Objective: Vital signs in last 24 hours: Temp:  [98.3 F (36.8 C)-98.7 F (37.1 C)] 98.4 F (36.9 C) (12/07 1306) Pulse Rate:  [78-100] 100 (12/07 1306) Resp:  [12-18] 18 (12/07 1306) BP: (112-148)/(83-102) 148/102 (12/07 1306) SpO2:  [98 %-100 %] 100 % (12/07 1306) Last BM Date: 05/14/18  Intake/Output from previous day: 12/06 0701 - 12/07 0700 In: 720 [P.O.:720] Out: -  Intake/Output this shift: No intake/output data recorded.  PE:  Abdomen--nondistended soft Lab Results: Recent Labs    05/15/18 0612 05/16/18 0644 05/17/18 0532  WBC 20.7* 16.1* 20.3*  HGB 9.4* 8.8* 9.2*  HCT 29.7* 27.5* 30.4*  PLT 458* 396 515*   BMET Recent Labs    05/15/18 0612 05/16/18 0644 05/17/18 0532  NA 133* 134* 135  K 4.4 4.7 4.6  CL 102 105 106  CO2 20* 21* 22  CREATININE 0.59 0.57 0.57   LFT No results for input(s): PROT, AST, ALT, ALKPHOS, BILITOT, BILIDIR, IBILI in the last 72 hours. PT/INR No results for input(s): LABPROT, INR in the last 72 hours. PANCREAS No results for input(s): LIPASE in the last 72 hours.       Studies/Results: No results found.  Medications: I have reviewed the patient's current medications.  Assessment:   1.  Ulcerative colitis with confluent ulcers throughout the colon.  Appears to be responding dramatically to steroids.   Plan: 1.  We will go ahead and advance to low residue diet. 2.  We will switch all of her medications to oral and if she continues to do well she could probably be discharged in the next couple of days.   Tresea MallJames L Ariadna Setter 05/17/2018, 3:19 PM  This note was created using voice recognition software. Minor errors may Have occurred unintentionally.  Pager: [phone] If no answer or after hours call [phone]

## 2018-05-18 LAB — CBC
HEMATOCRIT: 25.4 % — AB (ref 36.0–46.0)
Hemoglobin: 7.8 g/dL — ABNORMAL LOW (ref 12.0–15.0)
MCH: 26.3 pg (ref 26.0–34.0)
MCHC: 30.7 g/dL (ref 30.0–36.0)
MCV: 85.5 fL (ref 80.0–100.0)
Platelets: 485 10*3/uL — ABNORMAL HIGH (ref 150–400)
RBC: 2.97 MIL/uL — AB (ref 3.87–5.11)
RDW: 25.2 % — ABNORMAL HIGH (ref 11.5–15.5)
WBC: 29.9 10*3/uL — ABNORMAL HIGH (ref 4.0–10.5)
nRBC: 0.2 % (ref 0.0–0.2)

## 2018-05-18 LAB — STOOL CULTURE REFLEX - RSASHR

## 2018-05-18 LAB — STOOL CULTURE: E COLI SHIGA TOXIN ASSAY: NEGATIVE

## 2018-05-18 LAB — STOOL CULTURE REFLEX - CMPCXR

## 2018-05-18 NOTE — Progress Notes (Signed)
PROGRESS NOTE    Joann Robinson  ZOX:[id number] DOB: 14-Apr-1991 DOA: 05/12/2018 PCP: Patient, No Pcp Per    Brief Narrative: 27 year old with no significant past medical history, who presented with 2 weeks history of bloody diarrhea, accompanied with cramping abdominal pain. Evaluation in the emergency department show a hemoglobin at 6, occult blood positive.  CT abdomen and pelvis showed diffuse colitis. GI pathogen negative C. difficile negative.  GI was consulted patient underwent flexible sigmoidoscopy 05/14/2018 which showed severe colitis.  I discussed case with Dr. Randa Evens who recommended to start IV antibiotics and IV Solu-Medrol.    Assessment & Plan:   Principal Problem:   Symptomatic anemia Active Problems:   Colitis   Diarrhea   Hypokalemia   Severe colitis; ulcerative colitis.  Continue with IV fluids Started on IV Solu-Medrol with concern for inflammatory bowel disease. Discussed with GI recommends IV ciprofloxacin and Flagyl. Stool culture viral culture ordered. No virus isolated.  E coli negative.  Patient was treated with IV Solu-Medrol and IV antibiotics. Pathology consistent with chronic colitis.  Started on oral prednisone 12-07 Nurse noted small amount of blood in stool. Hemoglobin has decreased to 7.8, white count has increased to 29.  I will keep patient in the hospital today to make sure that she continues to be stable.  Anemia; acute blood loss anemia and iron deficiency anemia She has received 3 units during this admission Hemoglobin has decreased to 7.9.  Will monitor closely today for any GI bleed.  Continue with ferrous sulfate.   Tachycardia; IV fluids, IV bolus.  Blood transfusion. Improve  Hyponatremia; continue with IV fluids.   Hypokalemia ; Resolved  RN Pressure Injury Documentation:    Malnutrition Type:      Malnutrition Characteristics:      Nutrition Interventions:     Estimated body mass index is 32.22 kg/m as  calculated from the following:   Height as of this encounter: 5\' 6"  (1.676 m).   Weight as of this encounter: 90.5 kg.   DVT prophylaxis: SCDs, no anticoagulation due to GI bleed Code Status: Full code Family Communication: Discussed with mother who was at bedside Disposition Plan: Remain in the hospital for IV fluids, IV antibiotics, blood transfusion.  Consultants:   Dr. Randa Evens   Procedures:    sigmoidoscopy 05/14/2018   Antimicrobials:   Ciprofloxacin and Flagyl 05/14/2018   Subjective: Patient only reports mild mild cramping pain when she goes to the bathroom. She reported improvement of diarrhea Denies worsening abdominal pain. Objective: Vitals:   05/17/18 1306 05/17/18 2141 05/18/18 0504 05/18/18 1217  BP: (!) 148/102 113/72 119/79 122/80  Pulse: 100 95 94 91  Resp: 18 16 16 18   Temp: 98.4 F (36.9 C) 98 F (36.7 C) 99 F (37.2 C) 98.4 F (36.9 C)  TempSrc: Oral Oral Oral Oral  SpO2: 100% 98% 100% 100%  Weight:      Height:        Intake/Output Summary (Last 24 hours) at 05/18/2018 1305 Last data filed at 05/18/2018 1000 Gross per 24 hour  Intake 2191.67 ml  Output -  Net 2191.67 ml   Filed Weights   05/12/18 1823 05/13/18 0015  Weight: 90.3 kg 90.5 kg    Examination:  General exam: Not acute distress Respiratory system: Clear to auscultation Cardiovascular system: S1, S2 regular rhythm rate  Gastrointestinal system: Bowel sounds present, soft, nontender Central nervous system: Nonfocal Extremities: symmetric power.  Skin: No rashes   Data Reviewed: I have personally reviewed following  labs and imaging studies  CBC: Recent Labs  Lab 05/12/18 1812 05/13/18 1610 05/14/18 0544 05/15/18 0612 05/16/18 0644 05/17/18 0532 05/18/18 0803  WBC 21.5* 22.8* 23.6* 20.7* 16.1* 20.3* 29.9*  NEUTROABS 16.2* 16.6* 20.1*  --   --   --   --   HGB 6.2* 8.5* 7.1* 9.4* 8.8* 9.2* 7.8*  HCT 20.9* 27.1* 22.9* 29.7* 27.5* 30.4* 25.4*  MCV 70.1* 76.3*  77.4* 82.0 81.6 85.9 85.5  PLT 469* 381 359 458* 396 515* 485*   Basic Metabolic Panel: Recent Labs  Lab 05/12/18 1812 05/13/18 0838 05/13/18 1605 05/14/18 0544 05/15/18 0612 05/16/18 0644 05/17/18 0532  NA  --  128* 129* 130* 133* 134* 135  K  --  2.6* 2.8* 3.4* 4.4 4.7 4.6  CL  --  91* 92* 98 102 105 106  CO2  --  24 21* 20* 20* 21* 22  GLUCOSE  --  126* 119* 108* 160* 163* 144*  BUN  --  8 7 7 7 8 8   CREATININE  --  0.67 0.60 0.60 0.59 0.57 0.57  CALCIUM  --  7.7* 7.7* 7.6* 8.1* 8.0* 8.1*  MG 1.8 2.3  --  1.9  --   --   --    GFR: Estimated Creatinine Clearance: 121.8 mL/min (by C-G formula based on SCr of 0.57 mg/dL). Liver Function Tests: Recent Labs  Lab 05/12/18 1811 05/13/18 0838  AST 13* 13*  ALT 8 10  ALKPHOS 56 49  BILITOT 0.8 1.5*  PROT 6.4* 5.6*  ALBUMIN 2.4* 2.3*   Recent Labs  Lab 05/12/18 1811  LIPASE 80*   No results for input(s): AMMONIA in the last 168 hours. Coagulation Profile: No results for input(s): INR, PROTIME in the last 168 hours. Cardiac Enzymes: No results for input(s): CKTOTAL, CKMB, CKMBINDEX, TROPONINI in the last 168 hours. BNP (last 3 results) No results for input(s): PROBNP in the last 8760 hours. HbA1C: No results for input(s): HGBA1C in the last 72 hours. CBG: No results for input(s): GLUCAP in the last 168 hours. Lipid Profile: No results for input(s): CHOL, HDL, LDLCALC, TRIG, CHOLHDL, LDLDIRECT in the last 72 hours. Thyroid Function Tests: No results for input(s): TSH, T4TOTAL, FREET4, T3FREE, THYROIDAB in the last 72 hours. Anemia Panel: No results for input(s): VITAMINB12, FOLATE, FERRITIN, TIBC, IRON, RETICCTPCT in the last 72 hours. Sepsis Labs: Recent Labs  Lab 05/13/18 9604  LATICACIDVEN 1.0    Recent Results (from the past 240 hour(s))  Gastrointestinal Panel by PCR , Stool     Status: None   Collection Time: 05/12/18  7:33 PM  Result Value Ref Range Status   Campylobacter species NOT DETECTED NOT  DETECTED Final   Plesimonas shigelloides NOT DETECTED NOT DETECTED Final   Salmonella species NOT DETECTED NOT DETECTED Final   Yersinia enterocolitica NOT DETECTED NOT DETECTED Final   Vibrio species NOT DETECTED NOT DETECTED Final   Vibrio cholerae NOT DETECTED NOT DETECTED Final   Enteroaggregative E coli (EAEC) NOT DETECTED NOT DETECTED Final   Enteropathogenic E coli (EPEC) NOT DETECTED NOT DETECTED Final   Enterotoxigenic E coli (ETEC) NOT DETECTED NOT DETECTED Final   Shiga like toxin producing E coli (STEC) NOT DETECTED NOT DETECTED Final   Shigella/Enteroinvasive E coli (EIEC) NOT DETECTED NOT DETECTED Final   Cryptosporidium NOT DETECTED NOT DETECTED Final   Cyclospora cayetanensis NOT DETECTED NOT DETECTED Final   Entamoeba histolytica NOT DETECTED NOT DETECTED Final   Giardia lamblia NOT DETECTED NOT DETECTED  Final   Adenovirus F40/41 NOT DETECTED NOT DETECTED Final   Astrovirus NOT DETECTED NOT DETECTED Final   Norovirus GI/GII NOT DETECTED NOT DETECTED Final   Rotavirus A NOT DETECTED NOT DETECTED Final   Sapovirus (I, II, IV, and V) NOT DETECTED NOT DETECTED Final    Comment: Performed at [facility], [address] [place], [place] [address]  C difficile quick scan w PCR reflex     Status: None   Collection Time: 05/12/18  7:33 PM  Result Value Ref Range Status   C Diff antigen NEGATIVE NEGATIVE Final   C Diff toxin NEGATIVE NEGATIVE Final   C Diff interpretation No C. difficile detected.  Final    Comment: Performed at [facility], [address] [place], [place] [address]  Virus culture     Status: None   Collection Time: 05/14/18 12:47 PM  Result Value Ref Range Status   Viral Culture Comment  Final    Comment: (NOTE) Preliminary Report: No virus isolated at 24 hours. Next report to follow after 4 days. Performed At: [facility] [address] [place], [place] [id number] Jolene SchimkeNagendra Sanjai MD WG:[id number]:[phone]    Source of  Sample PERIRECTAL  Final    Comment: SIGMOID COLON BIOPSY R/O CMV PATIENT ON CIPRO Performed at [facility], [address] [place], [place] [address]   Stool culture (children & immunocomp patients)     Status: None (Preliminary result)   Collection Time: 05/14/18  1:06 PM  Result Value Ref Range Status   Salmonella/Shigella Screen Final report  Final   Campylobacter Culture PENDING  Incomplete   E coli, Shiga toxin Assay Negative Negative Final    Comment: (NOTE) Performed At: [facility] [address] [place], [place] [id number] Jolene SchimkeNagendra Sanjai MD KG:[id number]:[phone]   STOOL CULTURE REFLEX - RSASHR     Status: None   Collection Time: 05/14/18  1:06 PM  Result Value Ref Range Status   Stool Culture result 1 (RSASHR) Comment  Final    Comment: (NOTE) No Salmonella or Shigella recovered. Performed At: [facility]  [address] [place], [place] [id number] Jolene SchimkeNagendra Sanjai MD VZ:[id number]:[phone]          Radiology Studies: No results found.      Scheduled Meds: . ciprofloxacin  500 mg Oral BID  . ferrous sulfate  325 mg Oral BID WC  . metroNIDAZOLE  500 mg Oral Q8H  . predniSONE  20 mg Oral BID WC   Continuous Infusions: . sodium chloride 100 mL/hr at 05/18/18 0347     LOS: 5 days    Time spent: 35 minutes     Alba CoryBelkys A Dayden Viverette, MD [facility] Pager [phone] If 7PM-7AM, please contact night-coverage [email or web address] Password [email or web address] 05/18/2018, 1:05 PM

## 2018-05-18 NOTE — Progress Notes (Signed)
GI Discharge/Sign-Off Note  Subjective: Patient with severe colitis negative GI pathogen panel sigmoidoscopy showed severe ulcerative colitis with pathology showing chronic colitis.  Markedly improved with IV steroids switched over to prednisone  Principal Problem:   Symptomatic anemia Active Problems:   Colitis   Diarrhea   Hypokalemia   Results for orders placed or performed during the hospital encounter of 05/12/18 (from the past 72 hour(s))  CBC     Status: Abnormal   Collection Time: 05/16/18  6:44 AM  Result Value Ref Range   WBC 16.1 (H) 4.0 - 10.5 K/uL   RBC 3.37 (L) 3.87 - 5.11 MIL/uL   Hemoglobin 8.8 (L) 12.0 - 15.0 g/dL   HCT 86.527.5 (L) 78.436.0 - 69.646.0 %   MCV 81.6 80.0 - 100.0 fL   MCH 26.1 26.0 - 34.0 pg   MCHC 32.0 30.0 - 36.0 g/dL   RDW 29.523.9 (H) 28.411.5 - 13.215.5 %   Platelets 396 150 - 400 K/uL   nRBC 0.4 (H) 0.0 - 0.2 %    Comment: Performed at [facility], [address] [place], [place] [address]  Basic metabolic panel     Status: Abnormal   Collection Time: 05/16/18  6:44 AM  Result Value Ref Range   Sodium 134 (L) 135 - 145 mmol/L   Potassium 4.7 3.5 - 5.1 mmol/L   Chloride 105 98 - 111 mmol/L   CO2 21 (L) 22 - 32 mmol/L   Glucose, Bld 163 (H) 70 - 99 mg/dL   BUN 8 6 - 20 mg/dL   Creatinine, Ser 2.720.57 0.44 - 1.00 mg/dL   Calcium 8.0 (L) 8.9 - 10.3 mg/dL   GFR calc non Af Amer >60 >60 mL/min   GFR calc Af Amer >60 >60 mL/min   Anion gap 8 5 - 15    Comment: Performed at [facility], [address] [place], [place] [address]  CBC     Status: Abnormal   Collection Time: 05/17/18  5:32 AM  Result Value Ref Range   WBC 20.3 (H) 4.0 - 10.5 K/uL   RBC 3.54 (L) 3.87 - 5.11 MIL/uL   Hemoglobin 9.2 (L) 12.0 - 15.0 g/dL   HCT 40.330.4 (L) 47.436.0 - 25.946.0 %   MCV 85.9 80.0 - 100.0 fL   MCH 26.0 26.0 - 34.0 pg   MCHC 30.3 30.0 - 36.0 g/dL   RDW 56.324.7 (H) 87.511.5 - 64.315.5 %   Platelets 515 (H) 150 - 400 K/uL   nRBC 0.3 (H) 0.0 - 0.2 %   Comment: Performed at [facility], [address] [place], [place] [address]  Basic metabolic panel     Status: Abnormal   Collection Time: 05/17/18  5:32 AM  Result Value Ref Range   Sodium 135 135 - 145 mmol/L   Potassium 4.6 3.5 - 5.1 mmol/L   Chloride 106 98 - 111 mmol/L   CO2 22 22 - 32 mmol/L   Glucose, Bld 144 (H) 70 - 99 mg/dL   BUN 8 6 - 20 mg/dL   Creatinine, Ser 8.840.57 0.44 - 1.00 mg/dL   Calcium 8.1 (L) 8.9 - 10.3 mg/dL   GFR calc non Af Amer >60 >60 mL/min   GFR calc Af Amer >60 >60 mL/min   Anion gap 7 5 - 15    Comment: Performed at [facility], [address] [place], [place] [address]  CBC     Status: Abnormal  Collection Time: 05/18/18  8:03 AM  Result Value Ref Range   WBC 29.9 (H) 4.0 - 10.5 K/uL   RBC 2.97 (L) 3.87 - 5.11 MIL/uL   Hemoglobin 7.8 (L) 12.0 - 15.0 g/dL   HCT 16.1 (L) 09.6 - 04.5 %   MCV 85.5 80.0 - 100.0 fL   MCH 26.3 26.0 - 34.0 pg   MCHC 30.7 30.0 - 36.0 g/dL   RDW 40.9 (H) 81.1 - 91.4 %   Platelets 485 (H) 150 - 400 K/uL   nRBC 0.2 0.0 - 0.2 %    Comment: Performed at [facility], [address] [place], [place] [address]    No results found.  @MEDSSCHEDLED @  GI DISCHARGE PLANNING:  Diet: Low residue  Anticoagulation/Antiplatelet Rx Suggestions: Avoid NSAIDs  GI Medications: Would discharge home on Cipro 500 mg twice daily and Flagyl 500 mg 3 times daily for 5 days.  Would send home on prednisone 20 mg twice daily  Labs/Procedures Ordered:  GI FOLLOW UP:  Call [phone] to make appointment.   Doctor: Carman Ching  Time: 2 weeks.  Have discussed with patient and mother   Carman Ching, MD   Pager [phone] If no answer or after hours call [phone]

## 2018-05-19 LAB — CBC
HCT: 24.8 % — ABNORMAL LOW (ref 36.0–46.0)
Hemoglobin: 7.6 g/dL — ABNORMAL LOW (ref 12.0–15.0)
MCH: 26.8 pg (ref 26.0–34.0)
MCHC: 30.6 g/dL (ref 30.0–36.0)
MCV: 87.3 fL (ref 80.0–100.0)
Platelets: 384 10*3/uL (ref 150–400)
RBC: 2.84 MIL/uL — ABNORMAL LOW (ref 3.87–5.11)
RDW: 25.4 % — AB (ref 11.5–15.5)
WBC: 27 10*3/uL — ABNORMAL HIGH (ref 4.0–10.5)
nRBC: 0.6 % — ABNORMAL HIGH (ref 0.0–0.2)

## 2018-05-19 LAB — BASIC METABOLIC PANEL
Anion gap: 3 — ABNORMAL LOW (ref 5–15)
BUN: 16 mg/dL (ref 6–20)
CO2: 27 mmol/L (ref 22–32)
Calcium: 7.6 mg/dL — ABNORMAL LOW (ref 8.9–10.3)
Chloride: 106 mmol/L (ref 98–111)
Creatinine, Ser: 0.58 mg/dL (ref 0.44–1.00)
GFR calc Af Amer: >60 mL/min (ref >60)
GFR calc non Af Amer: >60 mL/min (ref >60)
Glucose, Bld: 141 mg/dL — ABNORMAL HIGH (ref 70–99)
Potassium: 4.1 mmol/L (ref 3.5–5.1)
SODIUM: 136 mmol/L (ref 135–145)

## 2018-05-19 LAB — HEMOGLOBIN AND HEMATOCRIT, BLOOD
HCT: 26 % — ABNORMAL LOW (ref 36.0–46.0)
Hemoglobin: 7.9 g/dL — ABNORMAL LOW (ref 12.0–15.0)

## 2018-05-19 MED ORDER — METRONIDAZOLE 500 MG PO TABS: 500.0000 mg | ORAL_TABLET | Freq: Three times a day (TID) | ORAL | 0 refills | Status: DC

## 2018-05-19 MED ORDER — CIPROFLOXACIN HCL 500 MG PO TABS: 500.0000 mg | ORAL_TABLET | Freq: Two times a day (BID) | ORAL | 0 refills | Status: DC

## 2018-05-19 MED ORDER — FAMOTIDINE 20 MG PO TABS: 20.0000 mg | ORAL_TABLET | Freq: Two times a day (BID) | ORAL | 1 refills | Status: DC

## 2018-05-19 MED ORDER — FERROUS SULFATE 325 (65 FE) MG PO TABS: 325.0000 mg | ORAL_TABLET | Freq: Three times a day (TID) | ORAL | 3 refills | Status: DC

## 2018-05-19 MED ORDER — PREDNISONE 20 MG PO TABS: 20.0000 mg | ORAL_TABLET | Freq: Two times a day (BID) | ORAL | 1 refills | Status: AC

## 2018-05-19 NOTE — Discharge Summary (Signed)
Physician Discharge Summary  Joann Robinson ZOX:[id number] DOB: 1990/08/01 DOA: 05/12/2018  PCP: Patient, No Pcp Per  Admit date: 05/12/2018 Discharge date: 05/19/2018  Admitted From: Home  Disposition: Home   Recommendations for Outpatient Follow-up:  1. Follow up with PCP in 1-2 weeks 2. Please obtain BMP/CBC in one week 3. Needs repeat HB level.  4. Follow up with GI for care of UC    Discharge Condition: Stable.  CODE STATUS: Full code.  Diet recommendation: Heart Healthy  Brief/Interim Summary: Brief Narrative: 27 year old with no significant past medical history, who presented with 2 weeks history of bloody diarrhea, accompanied with cramping abdominal pain. Evaluation in the emergency department show a hemoglobin at 6, occult blood positive.  CT abdomen and pelvis showed diffuse colitis. GI pathogen negative C. difficile negative.  GI was consulted patient underwent flexible sigmoidoscopy 05/14/2018 which showed severe colitis.  I discussed case with Dr. Randa Evens who recommended to start IV antibiotics and IV Solu-Medrol.    Assessment & Plan:   Principal Problem:   Symptomatic anemia Active Problems:   Colitis   Diarrhea   Hypokalemia   Severe colitis; ulcerative colitis.  Treated  with IV fluids. She was started on IV Solu-Medrol with concern for inflammatory bowel disease. Discussed with GI recommends IV ciprofloxacin and Flagyl. Stool culture viral culture ordered. No virus isolated.  E coli negative.  Patient was treated with IV Solu-Medrol and IV antibiotics. Pathology consistent with chronic colitis.  Started on oral prednisone 12-07 HB stable. WBC trending down.  Discharge on prednisone and antibiotics.   Anemia; acute blood loss anemia and iron deficiency anemia She has received 3 units during this admission Hemoglobin has decreased to 7.9.  Will monitor closely today for any GI bleed. No Further GI bleed. HB stable.  Continue with ferrous sulfate.   Repeated hb stable. Patient asymptomatic.   Tachycardia; IV fluids, IV bolus.  Blood transfusion. Improve  Hyponatremia; Treated  with IV fluids.   Hypokalemia ; Resolved  Discharge Diagnoses:  Principal Problem:   Symptomatic anemia Active Problems:   Colitis   Diarrhea   Hypokalemia    Discharge Instructions  Discharge Instructions    Diet - low sodium heart healthy   Complete by:  As directed    Increase activity slowly   Complete by:  As directed      Allergies as of 05/19/2018   No Known Allergies     Medication List    STOP taking these medications   ACTIVATED CHARCOAL PO   OVER THE COUNTER MEDICATION   promethazine 25 MG tablet Commonly known as:  PHENERGAN     TAKE these medications   ciprofloxacin 500 MG tablet Commonly known as:  CIPRO Take 1 tablet (500 mg total) by mouth 2 (two) times daily.   famotidine 20 MG tablet Commonly known as:  PEPCID Take 1 tablet (20 mg total) by mouth 2 (two) times daily.   ferrous sulfate 325 (65 FE) MG tablet Take 1 tablet (325 mg total) by mouth 3 (three) times daily with meals.   metroNIDAZOLE 500 MG tablet Commonly known as:  FLAGYL Take 1 tablet (500 mg total) by mouth every 8 (eight) hours.   predniSONE 20 MG tablet Commonly known as:  DELTASONE Take 1 tablet (20 mg total) by mouth 2 (two) times daily with a meal.      Follow-up Information    Carman Ching, MD Follow up in 1 week(s).   Specialty:  Gastroenterology Contact information: [address] [place] [address] [phone]        Frederich Chick., MD Follow up in 1 week(s).   Specialty:  Family Medicine Contact information: [address] [place] [address] [phone]          No Known Allergies  Consultations: GI, [facility]  Procedures/Studies: Ct Abdomen Pelvis W Contrast  Result Date: 05/12/2018 CLINICAL DATA:  Nausea, vomiting.  Generalized abdominal pain. EXAM: CT ABDOMEN AND PELVIS  WITH CONTRAST TECHNIQUE: Multidetector CT imaging of the abdomen and pelvis was performed using the standard protocol following bolus administration of intravenous contrast. CONTRAST:  ISOVUE-300 IOPAMIDOL (ISOVUE-300) INJECTION 61% COMPARISON:  None. FINDINGS: Lower chest: Lung bases are clear. No effusions. Heart is normal size. Hepatobiliary: Diffuse low-density throughout the liver compatible with fatty infiltration. No focal abnormality. Gallbladder unremarkable. Pancreas: No focal abnormality or ductal dilatation. Spleen: No focal abnormality.  Normal size. Adrenals/Urinary Tract: No adrenal abnormality. No focal renal abnormality. No stones or hydronephrosis. Urinary bladder is unremarkable. Stomach/Bowel: There is colonic wall thickening throughout much of the colon compatible with diffuse colitis, most pronounced in the descending colon and rectosigmoid colon. No evidence of bowel obstruction. Stomach and small bowel decompressed, unremarkable. Vascular/Lymphatic: No evidence of aneurysm or adenopathy. There are prominent mesenteric lymph nodes noted adjacent to the rectosigmoid colon, likely reactive. Reproductive: Uterus and adnexa unremarkable.  No mass. Other: No free fluid or free air. Musculoskeletal: No acute bony abnormality. IMPRESSION: Rather diffuse colonic wall thickening, most pronounced in the distal descending colon and rectosigmoid colon compatible with infectious or inflammatory colitis. Fatty infiltration of the liver. Electronically Signed   By: Charlett Nose M.D.   On: 05/12/2018 22:33      Subjective: Denies diarrhea. Denies black stool, or blood in the stool. Feeling better   Discharge Exam: Vitals:   05/19/18 0434 05/19/18 1303  BP: 123/70 118/63  Pulse: (!) 108 89  Resp: 18 16  Temp: 98.2 F (36.8 C) 99.1 F (37.3 C)  SpO2: 100% 100%   Vitals:   05/18/18 1217 05/18/18 2105 05/19/18 0434 05/19/18 1303  BP: 122/80 (!) 136/94 123/70 118/63  Pulse: 91 (!) 101  (!) 108 89  Resp: 18 16 18 16   Temp: 98.4 F (36.9 C) 98 F (36.7 C) 98.2 F (36.8 C) 99.1 F (37.3 C)  TempSrc: Oral Oral Oral Oral  SpO2: 100% 100% 100% 100%  Weight:      Height:        General: Pt is alert, awake, not in acute distress Cardiovascular: RRR, S1/S2 +, no rubs, no gallops Respiratory: CTA bilaterally, no wheezing, no rhonchi Abdominal: Soft, NT, ND, bowel sounds + Extremities: no edema, no cyanosis    The results of significant diagnostics from this hospitalization (including imaging, microbiology, ancillary and laboratory) are listed below for reference.     Microbiology: Recent Results (from the past 240 hour(s))  Gastrointestinal Panel by PCR , Stool     Status: None   Collection Time: 05/12/18  7:33 PM  Result Value Ref Range Status   Campylobacter species NOT DETECTED NOT DETECTED Final   Plesimonas shigelloides NOT DETECTED NOT DETECTED Final   Salmonella species NOT DETECTED NOT DETECTED Final   Yersinia enterocolitica NOT DETECTED NOT DETECTED Final   Vibrio species NOT DETECTED NOT DETECTED Final   Vibrio cholerae NOT DETECTED NOT DETECTED Final   Enteroaggregative E coli (EAEC) NOT DETECTED NOT DETECTED Final   Enteropathogenic E coli (EPEC) NOT DETECTED NOT  DETECTED Final   Enterotoxigenic E coli (ETEC) NOT DETECTED NOT DETECTED Final   Shiga like toxin producing E coli (STEC) NOT DETECTED NOT DETECTED Final   Shigella/Enteroinvasive E coli (EIEC) NOT DETECTED NOT DETECTED Final   Cryptosporidium NOT DETECTED NOT DETECTED Final   Cyclospora cayetanensis NOT DETECTED NOT DETECTED Final   Entamoeba histolytica NOT DETECTED NOT DETECTED Final   Giardia lamblia NOT DETECTED NOT DETECTED Final   Adenovirus F40/41 NOT DETECTED NOT DETECTED Final   Astrovirus NOT DETECTED NOT DETECTED Final   Norovirus GI/GII NOT DETECTED NOT DETECTED Final   Rotavirus A NOT DETECTED NOT DETECTED Final   Sapovirus (I, II, IV, and V) NOT DETECTED NOT DETECTED Final     Comment: Performed at [facility], [address] [place], [place] [address]  C difficile quick scan w PCR reflex     Status: None   Collection Time: 05/12/18  7:33 PM  Result Value Ref Range Status   C Diff antigen NEGATIVE NEGATIVE Final   C Diff toxin NEGATIVE NEGATIVE Final   C Diff interpretation No C. difficile detected.  Final    Comment: Performed at [facility], [address] [place], [place] [address]  Virus culture     Status: None   Collection Time: 05/14/18 12:47 PM  Result Value Ref Range Status   Viral Culture Comment  Final    Comment: (NOTE) Preliminary Report: No virus isolated at 4 days.  Next report to follow after 7 days. Performed At: [facility] [address] [place], [place] [id number] Jolene Schimke MD WG:[id number]    Source of Sample PERIRECTAL  Final    Comment: SIGMOID COLON BIOPSY R/O CMV PATIENT ON CIPRO Performed at [facility], [address] [place], [place] [address]   Stool culture (children & immunocomp patients)     Status: None   Collection Time: 05/14/18  1:06 PM  Result Value Ref Range Status   Salmonella/Shigella Screen Final report  Final   Campylobacter Culture Final report  Final   E coli, Shiga toxin Assay Negative Negative Final    Comment: (NOTE) Performed At: [facility] [address] [place], [place] [id number] Jolene Schimke MD KG:[id number]   STOOL CULTURE REFLEX - RSASHR     Status: None   Collection Time: 05/14/18  1:06 PM  Result Value Ref Range Status   Stool Culture result 1 (RSASHR) Comment  Final    Comment: (NOTE) No Salmonella or Shigella recovered. Performed At: [facility] [address] [place], [place] [id number] Jolene Schimke MD VZ:[id number]   STOOL CULTURE Reflex - CMPCXR     Status: None   Collection Time: 05/14/18  1:06 PM  Result Value Ref Range Status   Stool Culture result 1 (CMPCXR) Comment  Final    Comment:  (NOTE) No Campylobacter species isolated. Performed At: [facility] [address] [place], [place] [id number] Jolene Schimke MD SA:[id number]      Labs: BNP (last 3 results) No results for input(s): BNP in the last 8760 hours. Basic Metabolic Panel: Recent Labs  Lab 05/12/18 1812 05/13/18 2355  05/14/18 0544 05/15/18 0612 05/16/18 0644 05/17/18 0532 05/19/18 0549  NA  --  128*   < > 130* 133* 134* 135 136  K  --  2.6*   < > 3.4* 4.4 4.7 4.6 4.1  CL  --  91*   < > 98 102 105 106 106  CO2  --  24   < >  20* 20* 21* 22 27  GLUCOSE  --  126*   < > 108* 160* 163* 144* 141*  BUN  --  8   < > 7 7 8 8 16   CREATININE  --  0.67   < > 0.60 0.59 0.57 0.57 0.58  CALCIUM  --  7.7*   < > 7.6* 8.1* 8.0* 8.1* 7.6*  MG 1.8 2.3  --  1.9  --   --   --   --    < > = values in this interval not displayed.   Liver Function Tests: Recent Labs  Lab 05/12/18 1811 05/13/18 0838  AST 13* 13*  ALT 8 10  ALKPHOS 56 49  BILITOT 0.8 1.5*  PROT 6.4* 5.6*  ALBUMIN 2.4* 2.3*   Recent Labs  Lab 05/12/18 1811  LIPASE 80*   No results for input(s): AMMONIA in the last 168 hours. CBC: Recent Labs  Lab 05/12/18 1812 05/13/18 [id number] 05/14/18 0544 05/15/18 0612 05/16/18 0644 05/17/18 0532 05/18/18 0803 05/19/18 0549 05/19/18 1328  WBC 21.5* 22.8* 23.6* 20.7* 16.1* 20.3* 29.9* 27.0*  --   NEUTROABS 16.2* 16.6* 20.1*  --   --   --   --   --   --   HGB 6.2* 8.5* 7.1* 9.4* 8.8* 9.2* 7.8* 7.6* 7.9*  HCT 20.9* 27.1* 22.9* 29.7* 27.5* 30.4* 25.4* 24.8* 26.0*  MCV 70.1* 76.3* 77.4* 82.0 81.6 85.9 85.5 87.3  --   PLT 469* 381 359 458* 396 515* 485* 384  --    Cardiac Enzymes: No results for input(s): CKTOTAL, CKMB, CKMBINDEX, TROPONINI in the last 168 hours. BNP: Invalid input(s): POCBNP CBG: No results for input(s): GLUCAP in the last 168 hours. D-Dimer No results for input(s): DDIMER in the last 72 hours. Hgb A1c No results for input(s): HGBA1C in the last 72 hours. Lipid  Profile No results for input(s): CHOL, HDL, LDLCALC, TRIG, CHOLHDL, LDLDIRECT in the last 72 hours. Thyroid function studies No results for input(s): TSH, T4TOTAL, T3FREE, THYROIDAB in the last 72 hours.  Invalid input(s): FREET3 Anemia work up No results for input(s): VITAMINB12, FOLATE, FERRITIN, TIBC, IRON, RETICCTPCT in the last 72 hours. Urinalysis    Component Value Date/Time   COLORURINE STRAW (A) 05/12/2018 1933   APPEARANCEUR CLEAR 05/12/2018 1933   LABSPEC 1.004 (L) 05/12/2018 1933   PHURINE 7.0 05/12/2018 1933   GLUCOSEU NEGATIVE 05/12/2018 1933   HGBUR MODERATE (A) 05/12/2018 1933   BILIRUBINUR NEGATIVE 05/12/2018 1933   KETONESUR 20 (A) 05/12/2018 1933   PROTEINUR NEGATIVE 05/12/2018 1933   NITRITE NEGATIVE 05/12/2018 1933   LEUKOCYTESUR NEGATIVE 05/12/2018 1933   Sepsis Labs Invalid input(s): PROCALCITONIN,  WBC,  LACTICIDVEN Microbiology Recent Results (from the past 240 hour(s))  Gastrointestinal Panel by PCR , Stool     Status: None   Collection Time: 05/12/18  7:33 PM  Result Value Ref Range Status   Campylobacter species NOT DETECTED NOT DETECTED Final   Plesimonas shigelloides NOT DETECTED NOT DETECTED Final   Salmonella species NOT DETECTED NOT DETECTED Final   Yersinia enterocolitica NOT DETECTED NOT DETECTED Final   Vibrio species NOT DETECTED NOT DETECTED Final   Vibrio cholerae NOT DETECTED NOT DETECTED Final   Enteroaggregative E coli (EAEC) NOT DETECTED NOT DETECTED Final   Enteropathogenic E coli (EPEC) NOT DETECTED NOT DETECTED Final   Enterotoxigenic E coli (ETEC) NOT DETECTED NOT DETECTED Final   Shiga like toxin producing E coli (STEC) NOT DETECTED NOT DETECTED Final  Shigella/Enteroinvasive E coli (EIEC) NOT DETECTED NOT DETECTED Final   Cryptosporidium NOT DETECTED NOT DETECTED Final   Cyclospora cayetanensis NOT DETECTED NOT DETECTED Final   Entamoeba histolytica NOT DETECTED NOT DETECTED Final   Giardia lamblia NOT DETECTED NOT DETECTED  Final   Adenovirus F40/41 NOT DETECTED NOT DETECTED Final   Astrovirus NOT DETECTED NOT DETECTED Final   Norovirus GI/GII NOT DETECTED NOT DETECTED Final   Rotavirus A NOT DETECTED NOT DETECTED Final   Sapovirus (I, II, IV, and V) NOT DETECTED NOT DETECTED Final    Comment: Performed at [facility], [address] [place], [place] [address]  C difficile quick scan w PCR reflex     Status: None   Collection Time: 05/12/18  7:33 PM  Result Value Ref Range Status   C Diff antigen NEGATIVE NEGATIVE Final   C Diff toxin NEGATIVE NEGATIVE Final   C Diff interpretation No C. difficile detected.  Final    Comment: Performed at [facility], [address], [place], [place] [address]  Virus culture     Status: None   Collection Time: 05/14/18 12:47 PM  Result Value Ref Range Status   Viral Culture Comment  Final    Comment: (NOTE) Preliminary Report: No virus isolated at 4 days.  Next report to follow after 7 days. Performed At: [facility] [address] [place], [place] [id number] Jolene Schimke MD WG:[id number]    Source of Sample PERIRECTAL  Final    Comment: SIGMOID COLON BIOPSY R/O CMV PATIENT ON CIPRO Performed at [facility], [address], [place], [place] [address]   Stool culture (children & immunocomp patients)     Status: None   Collection Time: 05/14/18  1:06 PM  Result Value Ref Range Status   Salmonella/Shigella Screen Final report  Final   Campylobacter Culture Final report  Final   E coli, Shiga toxin Assay Negative Negative Final    Comment: (NOTE) Performed At: [facility] [address] [place], [place] [id number] Jolene Schimke MD KG:[id number]   STOOL CULTURE REFLEX - RSASHR     Status: None   Collection Time: 05/14/18  1:06 PM  Result Value Ref Range Status   Stool Culture result 1 (RSASHR) Comment  Final    Comment: (NOTE) No Salmonella or Shigella recovered. Performed At: [facility] [address] [place], [place] [id number] Jolene Schimke MD VZ:[id number]   STOOL CULTURE Reflex - CMPCXR     Status: None   Collection Time: 05/14/18  1:06 PM  Result Value Ref Range Status   Stool Culture result 1 (CMPCXR) Comment  Final    Comment: (NOTE) No Campylobacter species isolated. Performed At: [facility] [address] [place], [place] [id number] Jolene Schimke MD SA:[id number]      Time coordinating discharge: 35 minutes   SIGNED:   Alba Cory, MD  [facility] 05/19/2018, 2:40 PM Pager   If 7PM-7AM, please contact night-coverage [email or web address] Password [email or web address]

## 2018-05-19 NOTE — Progress Notes (Deleted)
Nuclear Medicine notified the RN that they would come to transport the patient to the NM Hepto study. RN relayed message to patient.

## 2018-05-22 LAB — VIRUS CULTURE

## 2019-05-14 ENCOUNTER — Other Ambulatory Visit: Payer: Self-pay

## 2019-05-14 DIAGNOSIS — Z20822 Contact with and (suspected) exposure to covid-19: Secondary | ICD-10-CM

## 2019-05-15 LAB — NOVEL CORONAVIRUS, NAA: SARS-CoV-2, NAA: NOT DETECTED

## 2019-11-07 IMAGING — CT CT ABD-PELV W/ CM
2 of 4 series · 16 of 46 positions shown, 18 images · IV contrast (ISOVUE)
Comparison: None.

CLINICAL DATA: Nausea, vomiting.  Generalized abdominal pain.

EXAM:
CT ABDOMEN AND PELVIS WITH CONTRAST
TECHNIQUE: Multidetector CT imaging of the abdomen and pelvis was performed
using the standard protocol following bolus administration of
intravenous contrast.
CONTRAST:  100mL 6FILFZ-LNN IOPAMIDOL (6FILFZ-LNN) INJECTION 61%

[Series 2: axial st · axial · 0.69mm/px · z∈[+1146,+1561]mm · 13 of 93 slices shown, 15 images]
[im 5/93  soft-tissue]
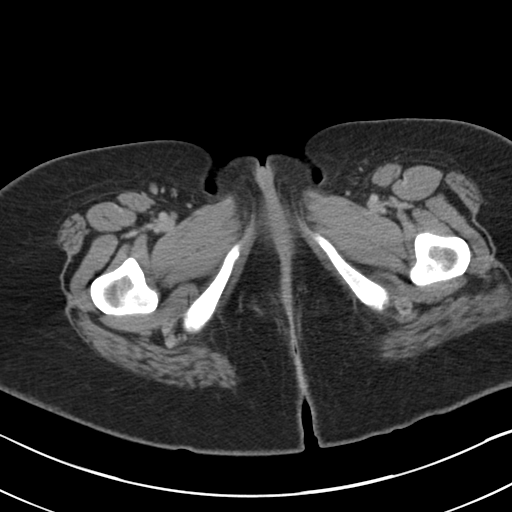
[im 5/93  bone]
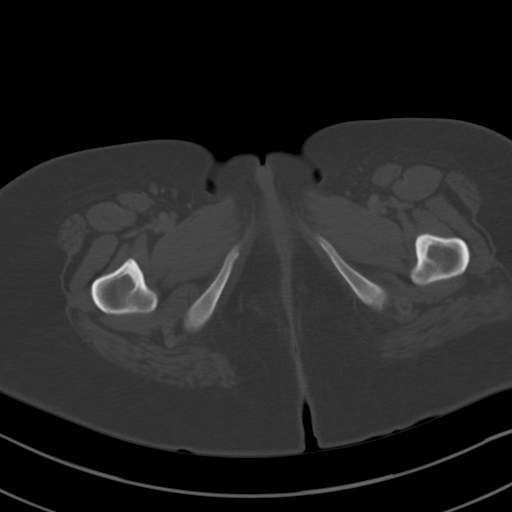
[im 14/93  soft-tissue]
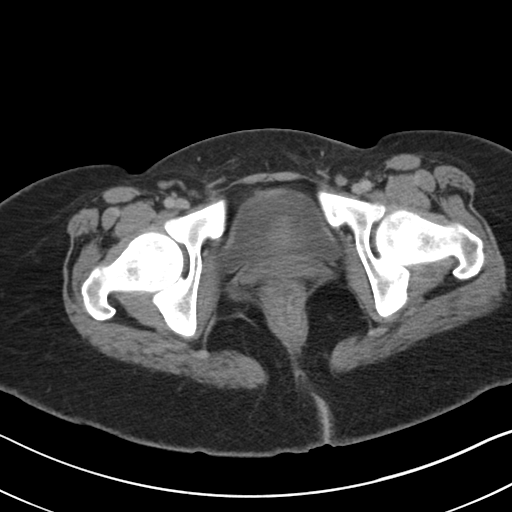
[im 19/93  soft-tissue]
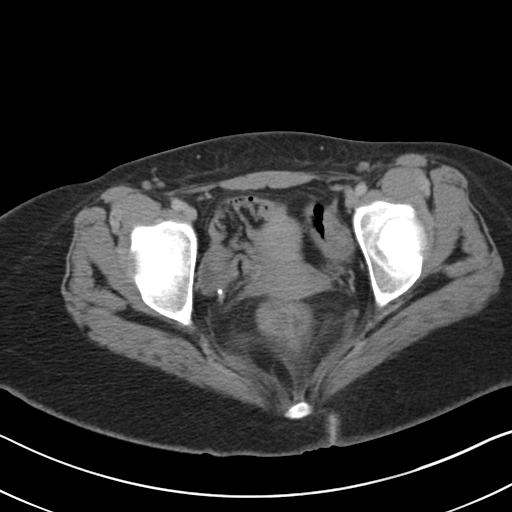
[im 28/93  soft-tissue]
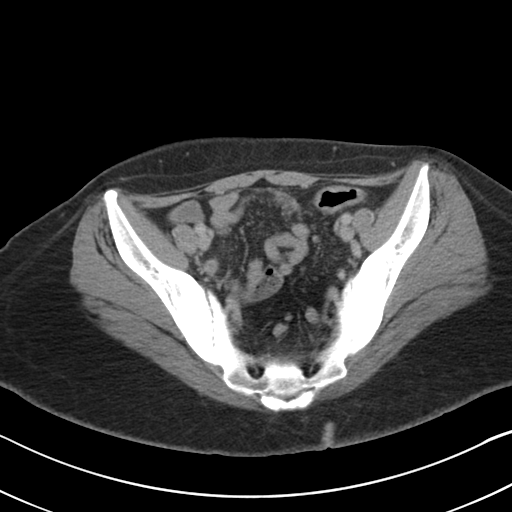
[im 33/93  soft-tissue]
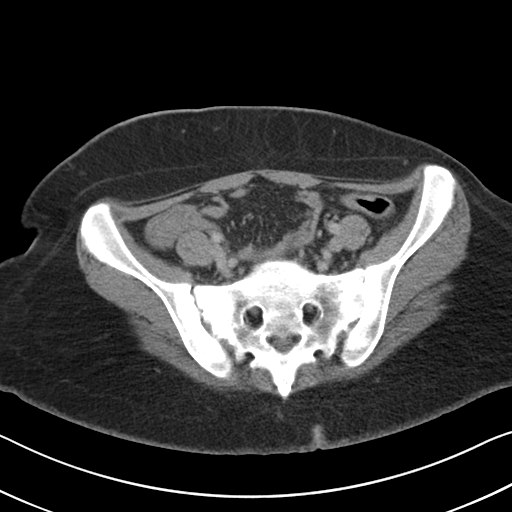
[im 42/93  soft-tissue]
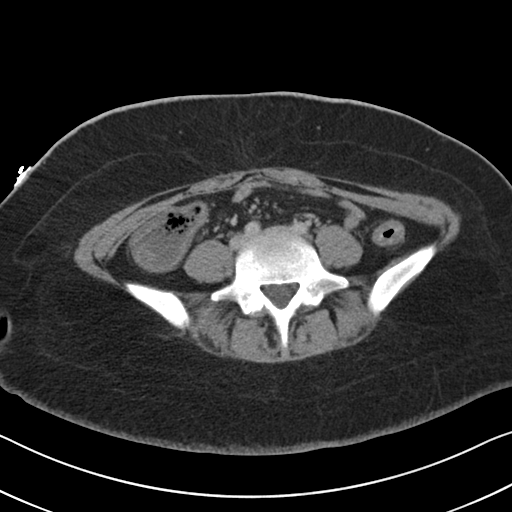
[im 47/93  soft-tissue]
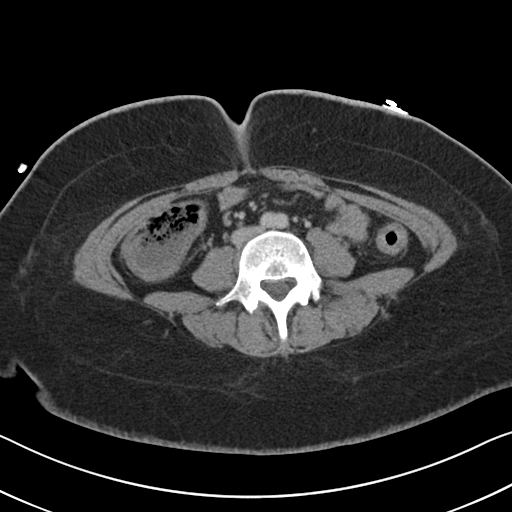
[im 51/93  soft-tissue]
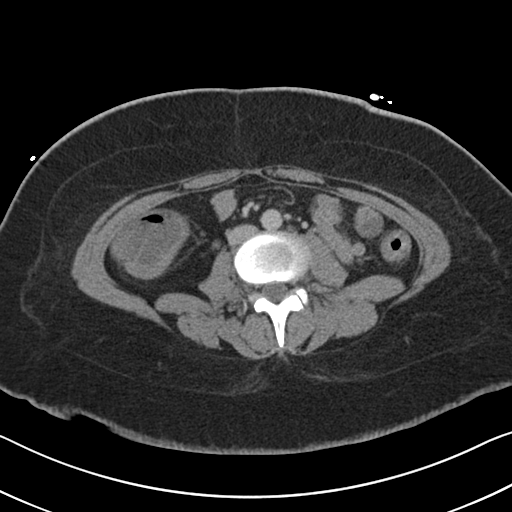
[im 60/93  soft-tissue]
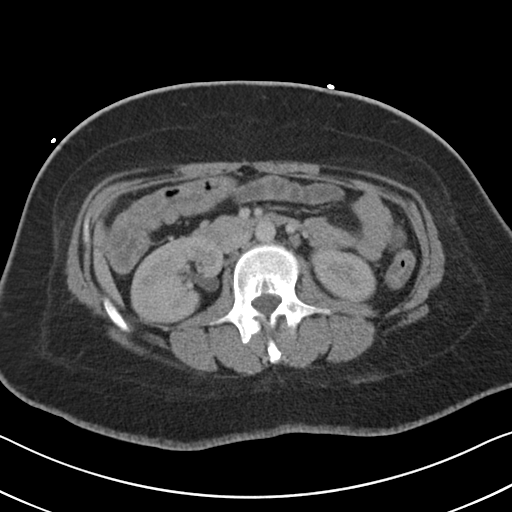
[im 60/93  bone]
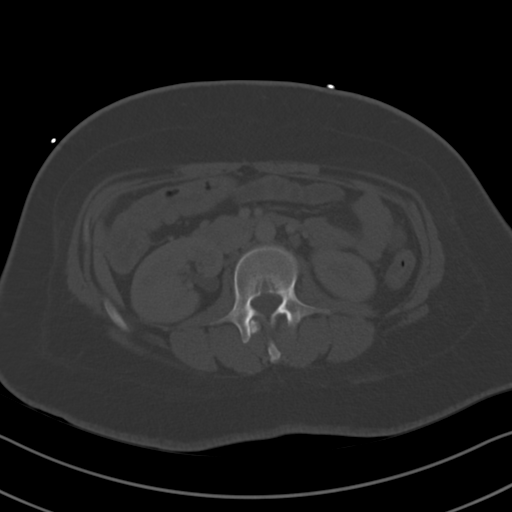
[im 65/93  soft-tissue]
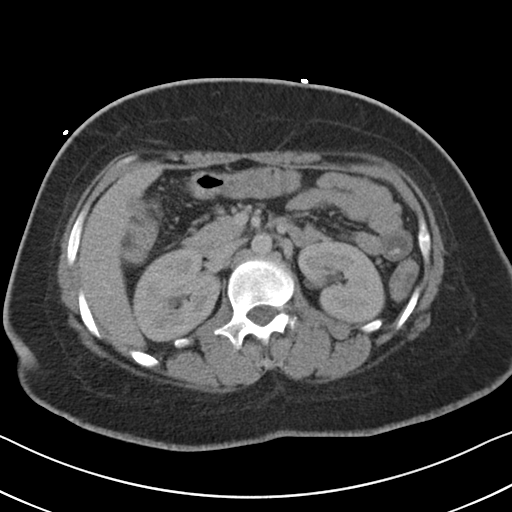
[im 74/93  soft-tissue]
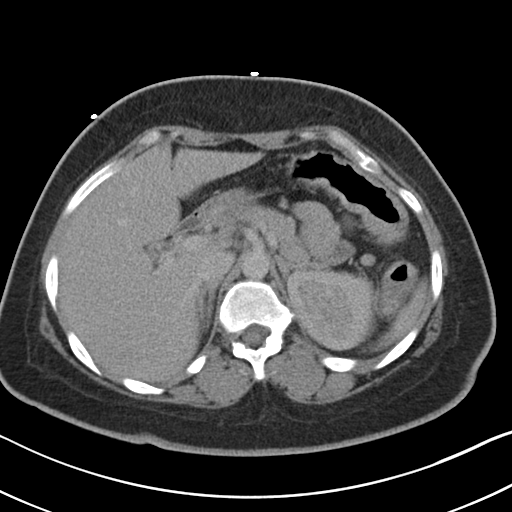
[im 79/93  soft-tissue]
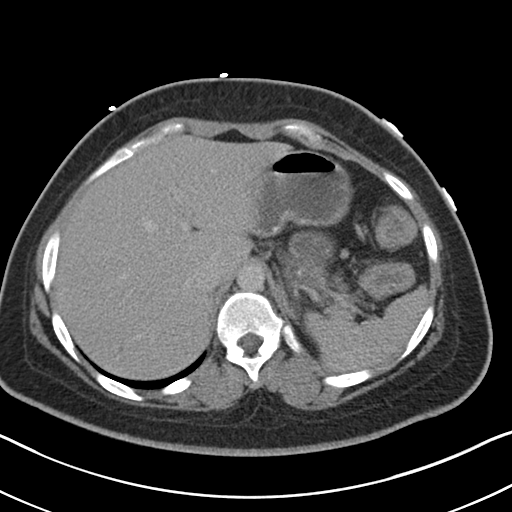
[im 88/93  soft-tissue]
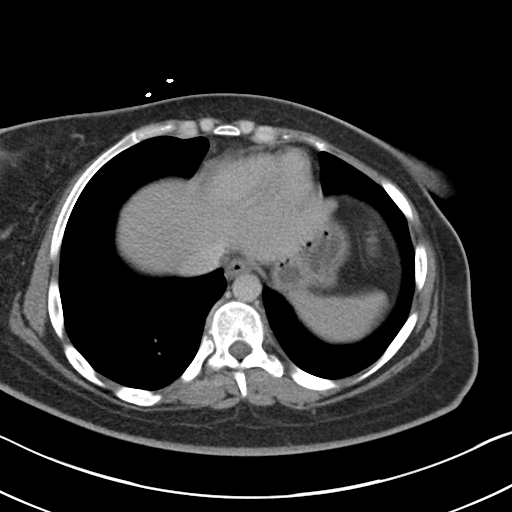

[Series 5: coronal st · coronal · 0.83mm/px · 3 of 99 slices shown]
[im 33/99  soft-tissue]
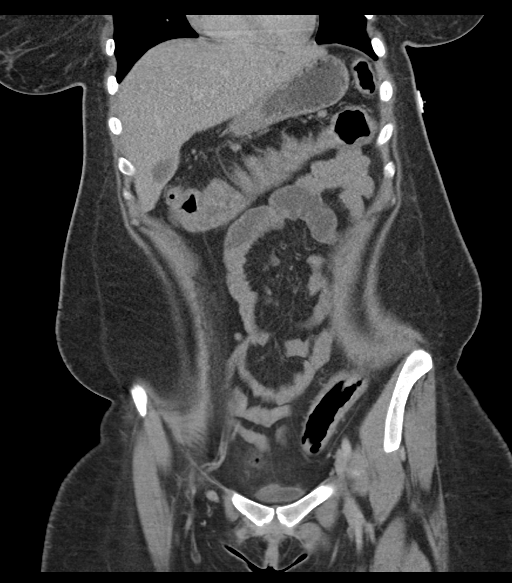
[im 44/99  soft-tissue]
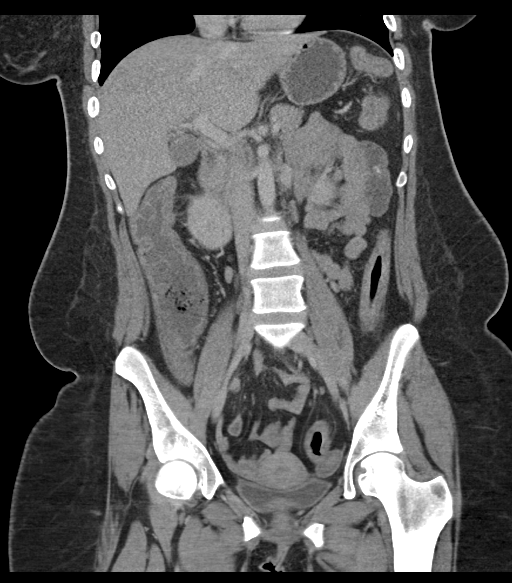
[im 55/99  soft-tissue]
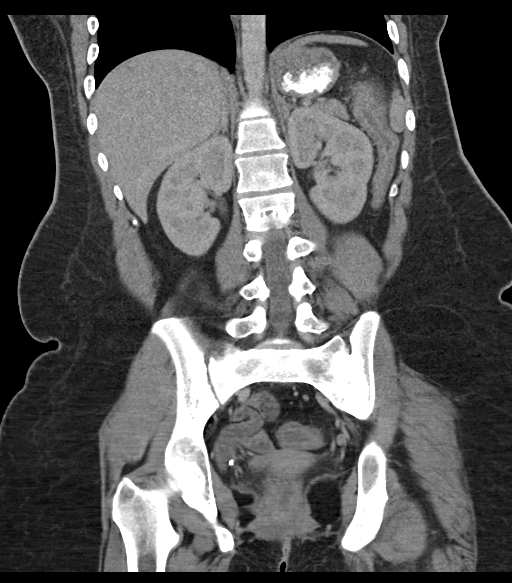

[16 of 46 positions shown; findings below may reference images not displayed]

FINDINGS: Lower chest: Lung bases are clear. No effusions. Heart is normal
size.

Hepatobiliary: Diffuse low-density throughout the liver compatible
with fatty infiltration. No focal abnormality. Gallbladder
unremarkable.

Pancreas: No focal abnormality or ductal dilatation.

Spleen: No focal abnormality.  Normal size.

Adrenals/Urinary Tract: No adrenal abnormality. No focal renal
abnormality. No stones or hydronephrosis. Urinary bladder is
unremarkable.

Stomach/Bowel: There is colonic wall thickening throughout much of
the colon compatible with diffuse colitis, most pronounced in the
descending colon and rectosigmoid colon. No evidence of bowel
obstruction. Stomach and small bowel decompressed, unremarkable.

Vascular/Lymphatic: No evidence of aneurysm or adenopathy. There are
prominent mesenteric lymph nodes noted adjacent to the rectosigmoid
colon, likely reactive.

Reproductive: Uterus and adnexa unremarkable.  No mass.

Other: No free fluid or free air.

Musculoskeletal: No acute bony abnormality.
IMPRESSION: Rather diffuse colonic wall thickening, most pronounced in the
distal descending colon and rectosigmoid colon compatible with
infectious or inflammatory colitis.

Fatty infiltration of the liver.

## 2021-04-05 ENCOUNTER — Emergency Department (HOSPITAL_COMMUNITY)
Admission: EM | Admit: 2021-04-05 | Discharge: 2021-04-06 | Disposition: A | Discharge status: Home / Self Care | Payer: BC Managed Care – PPO | Attending: Emergency Medicine | Admitting: Emergency Medicine

## 2021-04-05 ENCOUNTER — Encounter (HOSPITAL_COMMUNITY): Payer: Self-pay

## 2021-04-05 DIAGNOSIS — Z79899 Other long term (current) drug therapy: Secondary | ICD-10-CM | POA: Diagnosis not present

## 2021-04-05 DIAGNOSIS — D649 Anemia, unspecified: Secondary | ICD-10-CM | POA: Diagnosis not present

## 2021-04-05 DIAGNOSIS — Z8719 Personal history of other diseases of the digestive system: Secondary | ICD-10-CM | POA: Diagnosis not present

## 2021-04-05 DIAGNOSIS — R799 Abnormal finding of blood chemistry, unspecified: Secondary | ICD-10-CM | POA: Diagnosis present

## 2021-04-05 LAB — CBC WITH DIFFERENTIAL/PLATELET
Abs Immature Granulocytes: 0.03 10*3/uL (ref 0.00–0.07)
Basophils Absolute: 0.1 10*3/uL (ref 0.0–0.1)
Basophils Relative: 1 %
Eosinophils Absolute: 0.1 10*3/uL (ref 0.0–0.5)
Eosinophils Relative: 1 %
HCT: 23.6 % — ABNORMAL LOW (ref 36.0–46.0)
Hemoglobin: 5.9 g/dL — CL (ref 12.0–15.0)
Immature Granulocytes: 0 %
Lymphocytes Relative: 21 %
Lymphs Abs: 1.5 10*3/uL (ref 0.7–4.0)
MCH: 15.7 pg — ABNORMAL LOW (ref 26.0–34.0)
MCHC: 25 g/dL — ABNORMAL LOW (ref 30.0–36.0)
MCV: 62.9 fL — ABNORMAL LOW (ref 80.0–100.0)
Monocytes Absolute: 0.8 10*3/uL (ref 0.1–1.0)
Monocytes Relative: 11 %
Neutro Abs: 4.8 10*3/uL (ref 1.7–7.7)
Neutrophils Relative %: 66 %
Platelets: 245 10*3/uL (ref 150–400)
RBC: 3.75 MIL/uL — ABNORMAL LOW (ref 3.87–5.11)
RDW: 20.3 % — ABNORMAL HIGH (ref 11.5–15.5)
WBC: 7.2 10*3/uL (ref 4.0–10.5)
nRBC: 0 % (ref 0.0–0.2)

## 2021-04-05 LAB — COMPREHENSIVE METABOLIC PANEL
ALT: 9 U/L (ref 0–44)
AST: 17 U/L (ref 15–41)
Albumin: 3.8 g/dL (ref 3.5–5.0)
Alkaline Phosphatase: 54 U/L (ref 38–126)
Anion gap: 7 (ref 5–15)
BUN: 10 mg/dL (ref 6–20)
CO2: 23 mmol/L (ref 22–32)
Calcium: 8.7 mg/dL — ABNORMAL LOW (ref 8.9–10.3)
Chloride: 106 mmol/L (ref 98–111)
Creatinine, Ser: 0.51 mg/dL (ref 0.44–1.00)
GFR, Estimated: >60 mL/min (ref >60)
Glucose, Bld: 97 mg/dL (ref 70–99)
Potassium: 3.7 mmol/L (ref 3.5–5.1)
Sodium: 136 mmol/L (ref 135–145)
Total Bilirubin: 0.2 mg/dL — ABNORMAL LOW (ref 0.3–1.2)
Total Protein: 8.2 g/dL — ABNORMAL HIGH (ref 6.5–8.1)

## 2021-04-05 LAB — PREPARE RBC (CROSSMATCH)

## 2021-04-05 LAB — I-STAT BETA HCG BLOOD, ED (MC, WL, AP ONLY): I-stat hCG, quantitative: <5 m[IU]/mL (ref <5)

## 2021-04-05 MED ORDER — FERROUS SULFATE 325 (65 FE) MG PO TABS: 325.0000 mg | ORAL_TABLET | Freq: Three times a day (TID) | ORAL | 0 refills | Status: DC

## 2021-04-05 MED ORDER — SODIUM CHLORIDE 0.9 % IV SOLN
10.0000 mL/h | Freq: Once | INTRAVENOUS | Status: AC
  Administered 2021-04-05: 10 mL/h via INTRAVENOUS

## 2021-04-05 NOTE — ED Provider Notes (Signed)
Emergency Medicine Provider Triage Evaluation Note  Joann Robinson , a 30 y.o. female  was evaluated in triage.  Pt complains of anemia.  Patient had a previous scheduled appointment with GI for a flareup of her UC 2 weeks ago.  Blood work was done at this appointment, showed a hemoglobin of 5.8.  Patient has needed a transfusion before due to her UC.  She reports 3 bowel movements today all with blood.  She did have blood in her stool 2 weeks ago when she had a flare, however bleeding resolved.  She is having any fevers, nausea, vomiting, abdominal pain.  No dizziness, lightheadedness, weakness, or dyspnea on exertion.  Her last period was about a month ago.  She used to be on iron pills, however is not currently.  She is not on any blood thinners.  She is on immunosuppression for UC, does not take any other medications including steroids.  Review of Systems  Positive: Anemia, blood in stool Negative: N/v, abd pain  Physical Exam  There were no vitals taken for this visit. Gen:   Awake, no distress   Resp:  Normal effort  MSK:   Moves extremities without difficulty  Other:  No ttp of the abd  Medical Decision Making  Medically screening exam initiated at 5:40 PM.  Appropriate orders placed.  Joann Robinson was informed that the remainder of the evaluation will be completed by another provider, this initial triage assessment does not replace that evaluation, and the importance of remaining in the ED until their evaluation is complete.  labs   Alveria Apley, PA-C 04/05/21 1758    Tegeler, Canary Brim, MD 04/05/21 [phone]

## 2021-04-05 NOTE — ED Notes (Signed)
This RN took over patients care right after the 2nd unit of blood began to be transfused.

## 2021-04-05 NOTE — ED Triage Notes (Signed)
Pt arrived via POV, c/o low hemoglobin 5.8 today. States rectal bleeding that she noticed today.

## 2021-04-05 NOTE — ED Notes (Signed)
Per MD, Blood can be administered at a rate of 250.

## 2021-04-05 NOTE — ED Provider Notes (Signed)
[facility]-EMERGENCY DEPT Provider Note   CSN: [id number] Arrival date & time: 04/05/21  1721     History Chief Complaint  Patient presents with   Abnormal Lab    Joann Robinson is a 30 y.o. female.  Pt presents to the ED today with anemia.  Pt has a hx of ulcerative colitis.  She follows with Dr. Bosie Clos [facility]).  She currently takes balsalazide only for the UC. The pt said she had a flare up of UC about 2 weeks ago.  She had a lot of rectal bleeding.  She said she tried to get an appointment then, but she could not get an appt until today with Dr. Marge Duncans PA.  Pt said they did blood work at the office and she was called with instructions to report to the ED because her hgb was 5.8.  Pt said she feels fine.  She has required a blood transfusion when she was first diagnosed in 2019.  Hgb 7.9 on d/c in 2019.  She was taking iron, but has not been on it for awhile.      History reviewed. No pertinent past medical history.  Patient Active Problem List   Diagnosis Date Noted   Symptomatic anemia 05/12/2018   Colitis 05/12/2018   Diarrhea 05/12/2018   Hypokalemia 05/12/2018    Past Surgical History:  Procedure Laterality Date   BIOPSY  05/14/2018   Procedure: BIOPSY;  Surgeon: Carman Ching, MD;  Location: [facility];  Service: Endoscopy;;   FLEXIBLE SIGMOIDOSCOPY N/A 05/14/2018   Procedure: Arnell Sieving;  Surgeon: Carman Ching, MD;  Location: [facility];  Service: Endoscopy;  Laterality: N/A;     OB History   No obstetric history on file.     History reviewed. No pertinent family history.  Social History   Tobacco Use   Smoking status: Never   Smokeless tobacco: Never  Substance Use Topics   Alcohol use: Never   Drug use: Never    Home Medications Prior to Admission medications   Medication Sig Start Date End Date Taking? Authorizing Provider  ciprofloxacin (CIPRO) 500 MG tablet Take 1 tablet (500 mg total) by mouth 2  (two) times daily. 05/19/18   Regalado, Belkys A, MD  famotidine (PEPCID) 20 MG tablet Take 1 tablet (20 mg total) by mouth 2 (two) times daily. 05/19/18 05/19/19  Regalado, Belkys A, MD  ferrous sulfate 325 (65 FE) MG tablet Take 1 tablet (325 mg total) by mouth 3 (three) times daily with meals. 04/05/21   Jacalyn Lefevre, MD  metroNIDAZOLE (FLAGYL) 500 MG tablet Take 1 tablet (500 mg total) by mouth every 8 (eight) hours. 05/19/18   Regalado, Prentiss Bells, MD    Allergies    Patient has no known allergies.  Review of Systems   Review of Systems  Gastrointestinal:  Positive for blood in stool.  All other systems reviewed and are negative.  Physical Exam Updated Vital Signs BP 121/76   Pulse 75   Temp 99.3 F (37.4 C) (Oral)   Resp 18   SpO2 100%   Physical Exam Vitals and nursing note reviewed.  Constitutional:      Appearance: Normal appearance.  HENT:     Head: Normocephalic and atraumatic.     Right Ear: External ear normal.     Left Ear: External ear normal.     Nose: Nose normal.     Mouth/Throat:     Mouth: Mucous membranes are moist.     Pharynx:  Oropharynx is clear.  Eyes:     Extraocular Movements: Extraocular movements intact.     Conjunctiva/sclera: Conjunctivae normal.     Pupils: Pupils are equal, round, and reactive to light.  Cardiovascular:     Rate and Rhythm: Normal rate and regular rhythm.     Pulses: Normal pulses.     Heart sounds: Normal heart sounds.  Pulmonary:     Effort: Pulmonary effort is normal.     Breath sounds: Normal breath sounds.  Abdominal:     General: Abdomen is flat. Bowel sounds are normal.     Palpations: Abdomen is soft.  Musculoskeletal:        General: Normal range of motion.     Cervical back: Normal range of motion and neck supple.  Skin:    General: Skin is warm.     Capillary Refill: Capillary refill takes less than 2 seconds.  Neurological:     General: No focal deficit present.     Mental Status: She is alert and  oriented to person, place, and time.  Psychiatric:        Mood and Affect: Mood normal.        Behavior: Behavior normal.    ED Results / Procedures / Treatments   Labs (all labs ordered are listed, but only abnormal results are displayed) Labs Reviewed  CBC WITH DIFFERENTIAL/PLATELET - Abnormal; Notable for the following components:      Result Value   RBC 3.75 (*)    Hemoglobin 5.9 (*)    HCT 23.6 (*)    MCV 62.9 (*)    MCH 15.7 (*)    MCHC 25.0 (*)    RDW 20.3 (*)    All other components within normal limits  COMPREHENSIVE METABOLIC PANEL - Abnormal; Notable for the following components:   Calcium 8.7 (*)    Total Protein 8.2 (*)    Total Bilirubin 0.2 (*)    All other components within normal limits  I-STAT BETA HCG BLOOD, ED ([facility], [facility], [facility] ONLY)  TYPE AND SCREEN  PREPARE RBC (CROSSMATCH)    EKG None  Radiology No results found.  Procedures Procedures   Medications Ordered in ED Medications  0.9 %  sodium chloride infusion (10 mL/hr Intravenous New Bag/Given 04/05/21 2122)    ED Course  I have reviewed the triage vital signs and the nursing notes.  Pertinent labs & imaging results that were available during my care of the patient were reviewed by me and considered in my medical decision making (see chart for details).    MDM Rules/Calculators/A&P                           Pt's sx of rectal bleeding and abdominal pain have resolved.  She feels well.  I spoke to Dr. Dulce Sellar (on call for [facility]) and he recommended transfusion and then d/c as her sx have resolved.  Pt given 2 units of blood in the ED.  Pt tolerated transfusion well.  She is stable for d/c.  Return if worse.   CRITICAL CARE Performed by: Jacalyn Lefevre   Total critical care time: 30 minutes  Critical care time was exclusive of separately billable procedures and treating other patients.  Critical care was necessary to treat or prevent imminent or life-threatening  deterioration.  Critical care was time spent personally by me on the following activities: development of treatment plan with patient and/or surrogate as well as nursing, discussions  with consultants, evaluation of patient's response to treatment, examination of patient, obtaining history from patient or surrogate, ordering and performing treatments and interventions, ordering and review of laboratory studies, ordering and review of radiographic studies, pulse oximetry and re-evaluation of patient's condition.   Final Clinical Impression(s) / ED Diagnoses Final diagnoses:  Symptomatic anemia  History of ulcerative colitis    Rx / DC Orders ED Discharge Orders          Ordered    ferrous sulfate 325 (65 FE) MG tablet  3 times daily with meals        04/05/21 2115             Jacalyn Lefevre, MD 04/05/21 2244

## 2021-04-06 LAB — BPAM RBC
Blood Product Expiration Date: 202211272359
Blood Product Expiration Date: 202211272359
ISSUE DATE / TIME: 202210262049
ISSUE DATE / TIME: 202210262259
Unit Type and Rh: 5100
Unit Type and Rh: 5100

## 2021-04-06 LAB — TYPE AND SCREEN
ABO/RH(D): O POS
Antibody Screen: NEGATIVE
Unit division: 0
Unit division: 0

## 2022-02-26 ENCOUNTER — Telehealth: Payer: Self-pay | Admitting: Hematology and Oncology

## 2022-02-26 NOTE — Telephone Encounter (Signed)
Scheduled appt per 9/18 referral. Pt is aware of appt date and time. Pt is aware to arrive 15 mins prior to appt time and to bring and updated insurance card. Pt is aware of appt location.   

## 2022-03-22 ENCOUNTER — Other Ambulatory Visit: Payer: Self-pay

## 2022-03-22 ENCOUNTER — Inpatient Hospital Stay: Payer: BC Managed Care – PPO | Attending: Hematology and Oncology | Admitting: Hematology and Oncology

## 2022-03-22 DIAGNOSIS — K5190 Ulcerative colitis, unspecified, without complications: Secondary | ICD-10-CM | POA: Insufficient documentation

## 2022-03-22 DIAGNOSIS — D508 Other iron deficiency anemias: Secondary | ICD-10-CM

## 2022-03-22 DIAGNOSIS — D509 Iron deficiency anemia, unspecified: Secondary | ICD-10-CM | POA: Diagnosis not present

## 2022-03-22 DIAGNOSIS — D649 Anemia, unspecified: Secondary | ICD-10-CM

## 2022-03-22 NOTE — Progress Notes (Signed)
[facility] CONSULT NOTE  Patient Care Team: Frederich Chick., MD as PCP - General (Family Medicine)  CHIEF COMPLAINTS/PURPOSE OF CONSULTATION:  Chronic iron deficiency anemia  HISTORY OF PRESENTING ILLNESS:  Joann Robinson 31 y.o. female is here because of iron deficiency anemia related to intermittent bleeding from ulcerative colitis.  She was diagnosed with ulcerative colitis in 2017.  Since then she has been on different treatments for the ulcerative colitis.  She has had intermittent episodes of bleeding for which she had required blood transfusion most recently in November 2022.  She was prescribed oral iron therapy but unfortunately she keeps getting diarrhea from the iron supplementation.  She was referred to [place] for consideration for IV iron.  I reviewed her records extensively and collaborated the history with the patient.  MEDICAL HISTORY:  Ulcerative colitis  SURGICAL HISTORY: Past Surgical History:  Procedure Laterality Date   BIOPSY  05/14/2018   Procedure: BIOPSY;  Surgeon: Carman Ching, MD;  Location: [facility];  Service: Endoscopy;;   FLEXIBLE SIGMOIDOSCOPY N/A 05/14/2018   Procedure: Arnell Sieving;  Surgeon: Carman Ching, MD;  Location: [facility];  Service: Endoscopy;  Laterality: N/A;    SOCIAL HISTORY: Social History   Socioeconomic History   Marital status: Single    Spouse name: Not on file   Number of children: Not on file   Years of education: Not on file   Highest education level: Not on file  Occupational History   Not on file  Tobacco Use   Smoking status: Never   Smokeless tobacco: Never  Substance and Sexual Activity   Alcohol use: Never   Drug use: Never   Sexual activity: Not Currently  Other Topics Concern   Not on file  Social History Narrative   Not on file   Social Determinants of Health   Financial Resource Strain: Not on file  Food Insecurity: Not on file  Transportation Needs: Not on file   Physical Activity: Not on file  Stress: Not on file  Social Connections: Not on file  Intimate Partner Violence: Not on file    FAMILY HISTORY:    ALLERGIES:  has No Known Allergies.  MEDICATIONS:  Current Outpatient Medications  Medication Sig Dispense Refill   ciprofloxacin (CIPRO) 500 MG tablet Take 1 tablet (500 mg total) by mouth 2 (two) times daily. 10 tablet 0   famotidine (PEPCID) 20 MG tablet Take 1 tablet (20 mg total) by mouth 2 (two) times daily. 60 tablet 1   ferrous sulfate 325 (65 FE) MG tablet Take 1 tablet (325 mg total) by mouth 3 (three) times daily with meals. 90 tablet 0   metroNIDAZOLE (FLAGYL) 500 MG tablet Take 1 tablet (500 mg total) by mouth every 8 (eight) hours. 15 tablet 0   No current facility-administered medications for this visit.    REVIEW OF SYSTEMS:   Constitutional: Denies fevers, chills or abnormal night sweats   All other systems were reviewed with the patient and are negative.  PHYSICAL EXAMINATION: ECOG PERFORMANCE STATUS: 1 - Symptomatic but completely ambulatory  Vitals:   03/22/22 1544  BP: (!) 146/92  Pulse: 81  Resp: 18  Temp: 97.7 F (36.5 C)  SpO2: 100%   Filed Weights   03/22/22 1544  Weight: 207 lb 11.2 oz (94.2 kg)    GENERAL:alert, no distress and comfortable    LABORATORY DATA:  I have reviewed the data as listed Lab Results  Component Value Date   WBC 7.2  04/05/2021   HGB 5.9 (LL) 04/05/2021   HCT 23.6 (L) 04/05/2021   MCV 62.9 (L) 04/05/2021   PLT 245 04/05/2021   Lab Results  Component Value Date   NA 136 04/05/2021   K 3.7 04/05/2021   CL 106 04/05/2021   CO2 23 04/05/2021    RADIOGRAPHIC STUDIES: I have personally reviewed the radiological reports and agreed with the findings in the report.  ASSESSMENT AND PLAN:  Symptomatic anemia Lab review: 04/05/2021: Hemoglobin 5.8, MCV 55.5, RDW 20.5, ferritin 1.4, iron saturation 2%, tissue transglutaminase antibody: Negative 04/19/2021:  Hemoglobin 8.7 02/21/2022: Ferritin 3.3, iron saturation 5%, TIBC 421, hemoglobin 11.6, MCV 78.9, platelets 362  Most likely cause: 2017: Ulcerative colitis causing intermittent bleeding versus malabsorption Intolerance to oral iron therapy.  It causes diarrhea Blood transfusions 2017 and 2022 04/27/2019: Colonoscopy: Inactive chronic colitis negative for dysplasia  Recommendation: 1.  3 doses of IV iron Venofer 2. recheck labs in 4 months with telephone visit 2 days later.  All questions were answered. The patient knows to call the clinic with any problems, questions or concerns.    Harriette Ohara, MD 03/22/22

## 2022-03-22 NOTE — Assessment & Plan Note (Signed)
Lab review: 04/05/2021: Hemoglobin 5.8, MCV 55.5, RDW 20.5, ferritin 1.4, iron saturation 2%, tissue transglutaminase antibody: Negative 04/19/2021: Hemoglobin 8.7 02/21/2022: Ferritin 3.3, iron saturation 5%, TIBC 421, hemoglobin 11.6, MCV 78.9, platelets 362  Most likely cause: Ulcerative colitis causing intermittent bleeding versus malabsorption 04/27/2019: Colonoscopy: Inactive chronic colitis negative for dysplasia  Recommendation: 1.  3 doses of IV iron Venofer 2. recheck labs in 4 months with telephone visit 2 days later.

## 2022-03-30 MED FILL — Iron Sucrose Inj 20 MG/ML (Fe Equiv): INTRAVENOUS | Qty: 15 | Status: AC

## 2022-03-31 ENCOUNTER — Inpatient Hospital Stay: Payer: BC Managed Care – PPO

## 2022-03-31 VITALS — BP 139/90 | HR 70 | Temp 98.5°F | Resp 17

## 2022-03-31 DIAGNOSIS — D509 Iron deficiency anemia, unspecified: Secondary | ICD-10-CM | POA: Diagnosis not present

## 2022-03-31 DIAGNOSIS — D649 Anemia, unspecified: Secondary | ICD-10-CM

## 2022-03-31 MED ORDER — SODIUM CHLORIDE 0.9 % IV SOLN: Freq: Once | INTRAVENOUS | Status: AC

## 2022-03-31 MED ORDER — IRON SUCROSE 20 MG/ML IV SOLN
300.0000 mg | Freq: Once | INTRAVENOUS | Status: AC
Start: 2022-03-31 — End: 2022-03-31
  Administered 2022-03-31: 300 mg via INTRAVENOUS
  Filled 2022-03-31: qty 300

## 2022-03-31 NOTE — Patient Instructions (Signed)

## 2022-04-06 MED FILL — Iron Sucrose Inj 20 MG/ML (Fe Equiv): INTRAVENOUS | Qty: 15 | Status: AC

## 2022-04-07 ENCOUNTER — Inpatient Hospital Stay: Payer: BC Managed Care – PPO

## 2022-04-07 VITALS — BP 136/90 | HR 70 | Temp 98.7°F | Resp 16 | Ht 66.0 in | Wt 207.0 lb

## 2022-04-07 DIAGNOSIS — D649 Anemia, unspecified: Secondary | ICD-10-CM

## 2022-04-07 DIAGNOSIS — D509 Iron deficiency anemia, unspecified: Secondary | ICD-10-CM | POA: Diagnosis not present

## 2022-04-07 MED ORDER — SODIUM CHLORIDE 0.9 % IV SOLN: Freq: Once | INTRAVENOUS | Status: AC

## 2022-04-07 MED ORDER — SODIUM CHLORIDE 0.9 % IV SOLN
300.0000 mg | Freq: Once | INTRAVENOUS | Status: AC
  Administered 2022-04-07: 300 mg via INTRAVENOUS
  Filled 2022-04-07: qty 300

## 2022-04-07 NOTE — Patient Instructions (Signed)

## 2022-04-13 MED FILL — Iron Sucrose Inj 20 MG/ML (Fe Equiv): INTRAVENOUS | Qty: 15 | Status: AC

## 2022-04-14 ENCOUNTER — Inpatient Hospital Stay: Payer: BC Managed Care – PPO | Attending: Hematology and Oncology

## 2022-04-14 VITALS — BP 135/96 | HR 85 | Temp 98.6°F | Resp 16

## 2022-04-14 DIAGNOSIS — D508 Other iron deficiency anemias: Secondary | ICD-10-CM | POA: Insufficient documentation

## 2022-04-14 DIAGNOSIS — D649 Anemia, unspecified: Secondary | ICD-10-CM

## 2022-04-14 DIAGNOSIS — K529 Noninfective gastroenteritis and colitis, unspecified: Secondary | ICD-10-CM | POA: Insufficient documentation

## 2022-04-14 MED ORDER — SODIUM CHLORIDE 0.9 % IV SOLN: Freq: Once | INTRAVENOUS | Status: AC

## 2022-04-14 MED ORDER — SODIUM CHLORIDE 0.9 % IV SOLN
300.0000 mg | Freq: Once | INTRAVENOUS | Status: AC
  Administered 2022-04-14: 300 mg via INTRAVENOUS
  Filled 2022-04-14: qty 300

## 2022-04-14 NOTE — Patient Instructions (Signed)

## 2022-05-15 ENCOUNTER — Encounter: Payer: Self-pay | Admitting: Hematology and Oncology

## 2022-05-28 ENCOUNTER — Encounter (HOSPITAL_COMMUNITY): Payer: Self-pay

## 2022-05-28 ENCOUNTER — Emergency Department (HOSPITAL_COMMUNITY)
Admission: EM | Admit: 2022-05-28 | Discharge: 2022-05-29 | Disposition: A | Discharge status: Home / Self Care | Payer: BC Managed Care – PPO | Attending: Emergency Medicine | Admitting: Emergency Medicine

## 2022-05-28 DIAGNOSIS — N179 Acute kidney failure, unspecified: Secondary | ICD-10-CM | POA: Insufficient documentation

## 2022-05-28 DIAGNOSIS — R112 Nausea with vomiting, unspecified: Secondary | ICD-10-CM

## 2022-05-28 DIAGNOSIS — D72829 Elevated white blood cell count, unspecified: Secondary | ICD-10-CM | POA: Diagnosis not present

## 2022-05-28 DIAGNOSIS — K51919 Ulcerative colitis, unspecified with unspecified complications: Secondary | ICD-10-CM

## 2022-05-28 DIAGNOSIS — E860 Dehydration: Secondary | ICD-10-CM

## 2022-05-28 LAB — CBC WITH DIFFERENTIAL/PLATELET
Abs Immature Granulocytes: 0.07 10*3/uL (ref 0.00–0.07)
Basophils Absolute: 0.1 10*3/uL (ref 0.0–0.1)
Basophils Relative: 0 %
Eosinophils Absolute: 0 10*3/uL (ref 0.0–0.5)
Eosinophils Relative: 0 %
HCT: 47 % — ABNORMAL HIGH (ref 36.0–46.0)
Hemoglobin: 14.2 g/dL (ref 12.0–15.0)
Immature Granulocytes: 0 %
Lymphocytes Relative: 4 %
Lymphs Abs: 0.8 10*3/uL (ref 0.7–4.0)
MCH: 22.9 pg — ABNORMAL LOW (ref 26.0–34.0)
MCHC: 30.2 g/dL (ref 30.0–36.0)
MCV: 75.8 fL — ABNORMAL LOW (ref 80.0–100.0)
Monocytes Absolute: 1.4 10*3/uL — ABNORMAL HIGH (ref 0.1–1.0)
Monocytes Relative: 7 %
Neutro Abs: 16.7 10*3/uL — ABNORMAL HIGH (ref 1.7–7.7)
Neutrophils Relative %: 89 %
Platelets: 508 10*3/uL — ABNORMAL HIGH (ref 150–400)
RBC: 6.2 MIL/uL — ABNORMAL HIGH (ref 3.87–5.11)
RDW: 22.7 % — ABNORMAL HIGH (ref 11.5–15.5)
WBC: 19.1 10*3/uL — ABNORMAL HIGH (ref 4.0–10.5)
nRBC: 0 % (ref 0.0–0.2)

## 2022-05-28 LAB — COMPREHENSIVE METABOLIC PANEL
ALT: 9 U/L (ref 0–44)
AST: 15 U/L (ref 15–41)
Albumin: 3.7 g/dL (ref 3.5–5.0)
Alkaline Phosphatase: 55 U/L (ref 38–126)
Anion gap: 15 (ref 5–15)
BUN: 16 mg/dL (ref 6–20)
CO2: 20 mmol/L — ABNORMAL LOW (ref 22–32)
Calcium: 9.2 mg/dL (ref 8.9–10.3)
Chloride: 100 mmol/L (ref 98–111)
Creatinine, Ser: 1.03 mg/dL — ABNORMAL HIGH (ref 0.44–1.00)
GFR, Estimated: >60 mL/min (ref >60)
Glucose, Bld: 124 mg/dL — ABNORMAL HIGH (ref 70–99)
Potassium: 3.6 mmol/L (ref 3.5–5.1)
Sodium: 135 mmol/L (ref 135–145)
Total Bilirubin: 0.3 mg/dL (ref 0.3–1.2)
Total Protein: 8.8 g/dL — ABNORMAL HIGH (ref 6.5–8.1)

## 2022-05-28 LAB — LIPASE, BLOOD: Lipase: 29 U/L (ref 11–51)

## 2022-05-28 NOTE — ED Provider Triage Note (Signed)
Emergency Medicine Provider Triage Evaluation Note  Joann Robinson , a 31 y.o. female  was evaluated in triage.  Pt complains of nausea, vomiting, diarrhea, and generalized bodyaches over the last 3 weeks.  She has history significant for ulcerative colitis.  She states it has become increasingly difficult to keep food or liquids down.  Denies fever, urinary changes, hematemesis, hematochezia, melena.  Review of Systems  Positive: As above Negative: As above  Physical Exam  LMP 05/20/2022 (Exact Date)  Gen:   Awake, no distress   Resp:  Normal effort  MSK:   Moves extremities without difficulty  Other:  Abdomen is soft, nondistended  Medical Decision Making  Medically screening exam initiated at 7:44 PM.  Appropriate orders placed.  Joann Robinson was informed that the remainder of the evaluation will be completed by another provider, this initial triage assessment does not replace that evaluation, and the importance of remaining in the ED until their evaluation is complete.     Lenard Simmer, [place] 05/28/22 2011

## 2022-05-28 NOTE — ED Triage Notes (Signed)
Patient arrived with complaints of NV and generalized body aches over the last three weeks. No complaints of pain.

## 2022-05-29 MED ORDER — ONDANSETRON 8 MG PO TBDP: 8.0000 mg | ORAL_TABLET | Freq: Three times a day (TID) | ORAL | 0 refills | Status: AC | PRN

## 2022-05-29 MED ORDER — PROMETHAZINE HCL 25 MG RE SUPP: 25.0000 mg | Freq: Four times a day (QID) | RECTAL | 0 refills | Status: AC | PRN

## 2022-05-29 MED ORDER — LACTATED RINGERS IV BOLUS
2000.0000 mL | Freq: Once | INTRAVENOUS | Status: AC
  Administered 2022-05-29: 2000 mL via INTRAVENOUS

## 2022-05-29 MED ORDER — ONDANSETRON HCL 4 MG/2ML IJ SOLN
4.0000 mg | Freq: Once | INTRAMUSCULAR | Status: AC
  Administered 2022-05-29: 4 mg via INTRAVENOUS
  Filled 2022-05-29: qty 2

## 2022-05-29 MED ORDER — ONDANSETRON 8 MG PO TBDP: 8.0000 mg | ORAL_TABLET | Freq: Once | ORAL | Status: DC

## 2022-05-29 NOTE — ED Notes (Signed)
Patient refused Covid and Flu test because she states she has already been tested on 05/23/2022 for both and both were negative

## 2022-05-29 NOTE — ED Provider Notes (Signed)
[facility]-EMERGENCY DEPT Provider Note  CSN: [id number] Arrival date & time: 05/28/22 1857  Chief Complaint(s) Weakness  HPI Joann Robinson is a 31 y.o. female with history of ulcerative colitis on mesalamine presenting to the emergency department with nausea, vomiting, diarrhea.  She reports some hematochezia.  She reports that her symptoms have been worsening over the past couple days.  She reports that earlier this week she had a cough, malaise, has since improved, during this upper respiratory episode she took ginger for her symptoms, she reports that Ginger has triggered her ulcerative colitis episodes in the past.  She denies any current abdominal pain, had some crampy abdominal pain earlier with diarrhea but has resolved.  Unable to tolerate any p.o.  Feels dehydrated.  No fevers or chills.  Past Medical History Past Medical History:  Diagnosis Date   Ulcerative colitis [facility])    Patient Active Problem List   Diagnosis Date Noted   Symptomatic anemia 05/12/2018   Colitis 05/12/2018   Diarrhea 05/12/2018   Hypokalemia 05/12/2018   Home Medication(s) Prior to Admission medications   Medication Sig Start Date End Date Taking? Authorizing Provider  ferrous sulfate 325 (65 FE) MG tablet Take 1 tablet (325 mg total) by mouth 3 (three) times daily with meals. Patient not taking: Reported on 03/31/2022 04/05/21   Jacalyn Lefevre, MD  mesalamine (PENTASA) 250 MG CR capsule Take by mouth 4 (four) times daily.    [provider]                                                                                                                                    Past Surgical History Past Surgical History:  Procedure Laterality Date   BIOPSY  05/14/2018   Procedure: BIOPSY;  Surgeon: Carman Ching, MD;  Location: [facility];  Service: Endoscopy;;   FLEXIBLE SIGMOIDOSCOPY N/A 05/14/2018   Procedure: Arnell Sieving;  Surgeon: Carman Ching, MD;   Location: [facility];  Service: Endoscopy;  Laterality: N/A;   Family History No family history on file.  Social History Social History   Tobacco Use   Smoking status: Never   Smokeless tobacco: Never  Substance Use Topics   Alcohol use: Never   Drug use: Never   Allergies Patient has no known allergies.  Review of Systems Review of Systems  All other systems reviewed and are negative.   Physical Exam Vital Signs  I have reviewed the triage vital signs BP (!) 116/93   Pulse (!) 106   Temp 98.6 F (37 C) (Oral)   Resp 18   LMP 05/20/2022 (Exact Date)   SpO2 100%  Physical Exam Vitals and nursing note reviewed.  Constitutional:      General: She is not in acute distress.    Appearance: She is well-developed.  HENT:     Head: Normocephalic and atraumatic.     Mouth/Throat:     Mouth: Mucous  membranes are dry.  Eyes:     Pupils: Pupils are equal, round, and reactive to light.  Cardiovascular:     Rate and Rhythm: Normal rate and regular rhythm.     Heart sounds: No murmur heard. Pulmonary:     Effort: Pulmonary effort is normal. No respiratory distress.     Breath sounds: Normal breath sounds.  Abdominal:     General: Abdomen is flat.     Palpations: Abdomen is soft.     Tenderness: There is no abdominal tenderness.  Musculoskeletal:        General: No tenderness.     Right lower leg: No edema.     Left lower leg: No edema.  Skin:    General: Skin is warm and dry.  Neurological:     General: No focal deficit present.     Mental Status: She is alert. Mental status is at baseline.  Psychiatric:        Mood and Affect: Mood normal.        Behavior: Behavior normal.     ED Results and Treatments Labs (all labs ordered are listed, but only abnormal results are displayed) Labs Reviewed  COMPREHENSIVE METABOLIC PANEL - Abnormal; Notable for the following components:      Result Value   CO2 20 (*)    Glucose, Bld 124 (*)    Creatinine, Ser 1.03 (*)     Total Protein 8.8 (*)    All other components within normal limits  CBC WITH DIFFERENTIAL/PLATELET - Abnormal; Notable for the following components:   WBC 19.1 (*)    RBC 6.20 (*)    HCT 47.0 (*)    MCV 75.8 (*)    MCH 22.9 (*)    RDW 22.7 (*)    Platelets 508 (*)    Neutro Abs 16.7 (*)    Monocytes Absolute 1.4 (*)    All other components within normal limits  RESP PANEL BY RT-PCR (RSV, FLU A&B, COVID)  RVPGX2  GASTROINTESTINAL PANEL BY PCR, STOOL (REPLACES STOOL CULTURE)  C DIFFICILE QUICK SCREEN W PCR REFLEX    LIPASE, BLOOD  URINALYSIS, ROUTINE W REFLEX MICROSCOPIC  PREGNANCY, URINE                                                                                                                          Radiology No results found.  Pertinent labs & imaging results that were available during my care of the patient were reviewed by me and considered in my medical decision making (see MDM for details).  Medications Ordered in ED Medications  lactated ringers bolus 2,000 mL (has no administration in time range)  ondansetron (ZOFRAN) injection 4 mg (has no administration in time range)  Procedures Procedures  (including critical care time)  Medical Decision Making / ED Course   MDM:  31 year old female presenting to the emergency department with nausea, vomiting, diarrhea.  Patient well-appearing, but does appear mildly dehydrated.  Vital signs notable for mild tachycardia.  Differential includes ulcerative colitis flare, infectious colitis, doubt process such as diverticulitis, appendicitis, pancreatitis, cholecystitis without any focal abdominal tenderness or any abdominal pain at this time.  Patient symptoms have been triggered by ginger before so this is a possible cause of her episode.  Patient's symptoms could also be caused by viral  gastroenteritis given recent URI symptoms, will check COVID/flu/RSV swab.  Less likely ischemic colitis given young age and no abdominal pain.   Labs notable for leukocytosis which could be due to ulcerative colitis or infectious colitis.  Labs also notable for elevated creatinine suggesting dehydration.  Will give IV fluids, Zofran.  Will reassess patient.  Discussed with Dr. Dulce Sellar with GI who agrees with rehydration, given no abdominal pain agrees that CT scan would not be helpful at this time.  He recommended close outpatient follow-up with Dr. Bosie Clos or steroid taper but would prefer to avoid steroids.  If patient symptoms able to be controlled and patient able to tolerate p.o., likely discharge with antiemetics and close outpatient follow-up with her outpatient GI physician.  Clinical Course as of 05/29/22 1632  Tue May 29, 2022  0455 WBC(!): 19.1 [MR]    Clinical Course User Index [MR] Redwine, Madison A, PA-C     Additional history obtained:  -External records from outside source obtained and reviewed including: Chart review including previous notes, labs, imaging, consultation notes including DC Summ 2019   Lab Tests: -I ordered, reviewed, and interpreted labs.   The pertinent results include:   Labs Reviewed  COMPREHENSIVE METABOLIC PANEL - Abnormal; Notable for the following components:      Result Value   CO2 20 (*)    Glucose, Bld 124 (*)    Creatinine, Ser 1.03 (*)    Total Protein 8.8 (*)    All other components within normal limits  CBC WITH DIFFERENTIAL/PLATELET - Abnormal; Notable for the following components:   WBC 19.1 (*)    RBC 6.20 (*)    HCT 47.0 (*)    MCV 75.8 (*)    MCH 22.9 (*)    RDW 22.7 (*)    Platelets 508 (*)    Neutro Abs 16.7 (*)    Monocytes Absolute 1.4 (*)    All other components within normal limits  RESP PANEL BY RT-PCR (RSV, FLU A&B, COVID)  RVPGX2  GASTROINTESTINAL PANEL BY PCR, STOOL (REPLACES STOOL CULTURE)  C DIFFICILE QUICK  SCREEN W PCR REFLEX    LIPASE, BLOOD  URINALYSIS, ROUTINE W REFLEX MICROSCOPIC  PREGNANCY, URINE    Notable for leukocytosis, AKI  Medicines ordered and prescription drug management: Meds ordered this encounter  Medications   lactated ringers bolus 2,000 mL   ondansetron (ZOFRAN) injection 4 mg    -I have reviewed the patients home medicines and have made adjustments as needed   Consultations Obtained: I requested consultation with the gastroenterologist,  and discussed lab and imaging findings as well as pertinent plan - they recommend: rehydrate, control symptoms, stool studies, discharge if improve with outpatient f/u with Dr. Bosie Clos   Cardiac Monitoring: The patient was maintained on a cardiac monitor.  I personally viewed and interpreted the cardiac monitored which showed an underlying rhythm of: sinus tachycardia   Reevaluation: After the  interventions noted above, I reevaluated the patient and found that they have improved  Co morbidities that complicate the patient evaluation  Past Medical History:  Diagnosis Date   Ulcerative colitis (HCC)       Dispostion: Disposition decision including need for hospitalization was considered, and patient disposition pending at time of sign out.    Final Clinical Impression(s) / ED Diagnoses Final diagnoses:  Nausea vomiting and diarrhea  AKI (acute kidney injury) (HCC)     This chart was dictated using voice recognition software.  Despite best efforts to proofread,  errors can occur which can change the documentation meaning.    Lonell GrandchildScheving, Anatole Apollo L, MD 05/29/22 [phone]

## 2022-05-29 NOTE — ED Provider Notes (Addendum)
  Physical Exam  BP (!) 140/93   Pulse 86   Temp 98.3 F (36.8 C) (Oral)   Resp 16   LMP 05/20/2022 (Exact Date)   SpO2 100%   Physical Exam  Procedures  Procedures  ED Course / MDM   Clinical Course as of 05/29/22 1907  Tue May 29, 2022  0455 WBC(!): 19.1 [MR]  1904 Pt reassessed. Pt's VSS and WNL. Pt's cap refill < 3 seconds. Pt has been hydrated in the ER and now passed po challenge. We will discharge with antiemetic. Strict ER return precautions have been discussed and pt will return if he is unable to tolerate fluids and symptoms are getting worse.  [AN]    Clinical Course User Index [AN] Derwood Kaplan, MD [MR] Saddie Benders, PA-C   Medical Decision Making Risk Prescription drug management.   PT with UC. N/v/diarrhea - bloody stools. + dehydrated. No abd pain. GI aware.  Pt getting ivf and nausea meds. If responds, pt can be d/c with close follow up. They dont want steroids unless needed.         Derwood Kaplan, MD 05/29/22 1653    Derwood Kaplan, MD 05/29/22 1907

## 2022-05-29 NOTE — Discharge Instructions (Addendum)
He was seen in the ER for nausea, vomiting and ulcerative colitis flareup.  Your GI team has advised that you be given supportive medications and for you to contact them for close appointment.  Please call your GI team tomorrow for earliest available appointment.  We have prescribed you nausea medications.  Return to the emergency room if you are unable to keep anything down, you start having severe diarrhea or bloody vomit or bloody stools.  Also return to the ER if you start having fevers.

## 2022-05-29 NOTE — ED Notes (Signed)
Discharge pending completion of fluids

## 2022-07-18 ENCOUNTER — Other Ambulatory Visit: Payer: Self-pay | Admitting: *Deleted

## 2022-07-18 DIAGNOSIS — D649 Anemia, unspecified: Secondary | ICD-10-CM

## 2022-07-20 ENCOUNTER — Inpatient Hospital Stay: Payer: BC Managed Care – PPO | Attending: Hematology and Oncology

## 2022-07-20 ENCOUNTER — Other Ambulatory Visit: Payer: Self-pay

## 2022-07-20 DIAGNOSIS — K529 Noninfective gastroenteritis and colitis, unspecified: Secondary | ICD-10-CM | POA: Diagnosis present

## 2022-07-20 DIAGNOSIS — D649 Anemia, unspecified: Secondary | ICD-10-CM

## 2022-07-20 DIAGNOSIS — D500 Iron deficiency anemia secondary to blood loss (chronic): Secondary | ICD-10-CM | POA: Diagnosis present

## 2022-07-20 LAB — CBC WITH DIFFERENTIAL (CANCER CENTER ONLY)
Abs Immature Granulocytes: 0.03 10*3/uL (ref 0.00–0.07)
Basophils Absolute: 0 10*3/uL (ref 0.0–0.1)
Basophils Relative: 1 %
Eosinophils Absolute: 0.2 10*3/uL (ref 0.0–0.5)
Eosinophils Relative: 2 %
HCT: 33.1 % — ABNORMAL LOW (ref 36.0–46.0)
Hemoglobin: 10.4 g/dL — ABNORMAL LOW (ref 12.0–15.0)
Immature Granulocytes: 0 %
Lymphocytes Relative: 14 %
Lymphs Abs: 1.1 10*3/uL (ref 0.7–4.0)
MCH: 24.6 pg — ABNORMAL LOW (ref 26.0–34.0)
MCHC: 31.4 g/dL (ref 30.0–36.0)
MCV: 78.3 fL — ABNORMAL LOW (ref 80.0–100.0)
Monocytes Absolute: 0.9 10*3/uL (ref 0.1–1.0)
Monocytes Relative: 11 %
Neutro Abs: 5.7 10*3/uL (ref 1.7–7.7)
Neutrophils Relative %: 72 %
Platelet Count: 436 10*3/uL — ABNORMAL HIGH (ref 150–400)
RBC: 4.23 MIL/uL (ref 3.87–5.11)
RDW: 16.3 % — ABNORMAL HIGH (ref 11.5–15.5)
WBC Count: 7.9 10*3/uL (ref 4.0–10.5)
nRBC: 0 % (ref 0.0–0.2)

## 2022-07-20 LAB — FERRITIN: Ferritin: 2 ng/mL — ABNORMAL LOW (ref 11–307)

## 2022-07-20 LAB — IRON AND IRON BINDING CAPACITY (CC-WL,HP ONLY)
Iron: 53 ug/dL (ref 28–170)
Saturation Ratios: 13 % (ref 10.4–31.8)
TIBC: 407 ug/dL (ref 250–450)
UIBC: 354 ug/dL

## 2022-07-20 NOTE — Progress Notes (Signed)
HEMATOLOGY-ONCOLOGY TELEPHONE VISIT PROGRESS NOTE  I connected with our patient on 07/23/22 at  3:15 PM EST by telephone and verified that I am speaking with the correct person using two identifiers.  I discussed the limitations, risks, security and privacy concerns of performing an evaluation and management service by telephone and the availability of in person appointments.  I also discussed with the patient that there may be a patient responsible charge related to this service. The patient expressed understanding and agreed to proceed.   History of Present Illness: Joann Robinson 32 y.o. female is here because of iron deficiency anemia related to intermittent bleeding from ulcerative colitis. She presents to the clinic for a  telephone follow-up to discuss lab results.  Overall she feels relatively well since iron infusions.  Can plaints of mild to moderate fatigue.  Her ulcerative colitis appears to be under good control.    REVIEW OF SYSTEMS:   Constitutional: Denies fevers, chills or abnormal weight loss All other systems were reviewed with the patient and are negative. Observations/Objective:     Assessment Plan:  Symptomatic anemia Lab review: 04/05/2021: Hemoglobin 5.8, MCV 55.5, RDW 20.5, ferritin 1.4, iron saturation 2%, tissue transglutaminase antibody: Negative 04/19/2021: Hemoglobin 8.7 02/21/2022: Ferritin 3.3, iron saturation 5%, TIBC 421, hemoglobin 11.6, MCV 78.9, platelets 362   Most likely cause: 2017: Ulcerative colitis causing intermittent bleeding versus malabsorption Intolerance to oral iron therapy.  It causes diarrhea Blood transfusions 2017 and 2022 04/27/2019: Colonoscopy: Inactive chronic colitis negative for dysplasia   Treatment: IV iron Venofer: October 2023  Lab review: 07/20/2022: Hemoglobin 10.4, MCV 78.3, platelets 436, RDW 16.3, ferritin 2, iron saturation 13%  Based on the above lab results I recommend 3 doses of IV iron 21-monthfollow-up with  labs   I discussed the assessment and treatment plan with the patient. The patient was provided an opportunity to ask questions and all were answered. The patient agreed with the plan and demonstrated an understanding of the instructions. The patient was advised to call back or seek an in-person evaluation if the symptoms worsen or if the condition fails to improve as anticipated.   I provided 12 minutes of non-face-to-face time during this encounter.  This includes time for charting and coordination of care   VHarriette Ohara MD  I DGardiner Coinsam acting as a scribe for Dr.Vinay Gudena  I have reviewed the above documentation for accuracy and completeness, and I agree with the above.

## 2022-07-23 ENCOUNTER — Inpatient Hospital Stay (HOSPITAL_BASED_OUTPATIENT_CLINIC_OR_DEPARTMENT_OTHER): Payer: BC Managed Care – PPO | Admitting: Hematology and Oncology

## 2022-07-23 DIAGNOSIS — D649 Anemia, unspecified: Secondary | ICD-10-CM

## 2022-07-23 NOTE — Assessment & Plan Note (Signed)
Symptomatic anemia Lab review: 04/05/2021: Hemoglobin 5.8, MCV 55.5, RDW 20.5, ferritin 1.4, iron saturation 2%, tissue transglutaminase antibody: Negative 04/19/2021: Hemoglobin 8.7 02/21/2022: Ferritin 3.3, iron saturation 5%, TIBC 421, hemoglobin 11.6, MCV 78.9, platelets 362   Most likely cause: 2017: Ulcerative colitis causing intermittent bleeding versus malabsorption Intolerance to oral iron therapy.  It causes diarrhea Blood transfusions 2017 and 2022 04/27/2019: Colonoscopy: Inactive chronic colitis negative for dysplasia   Treatment: IV iron Venofer: October 2023  Lab review: 07/20/2022: Hemoglobin 10.4, MCV 78.3, platelets 436, RDW 16.3, ferritin 2, iron saturation 13%  Based on the above lab results I recommend 3 doses of IV iron

## 2022-07-26 ENCOUNTER — Telehealth: Payer: Self-pay | Admitting: Hematology and Oncology

## 2022-07-26 NOTE — Telephone Encounter (Signed)
Scheduled appointment per los. Patient is aware.

## 2022-08-04 ENCOUNTER — Inpatient Hospital Stay: Payer: BC Managed Care – PPO

## 2022-08-04 VITALS — BP 132/90 | HR 70 | Temp 98.4°F | Resp 18

## 2022-08-04 DIAGNOSIS — D649 Anemia, unspecified: Secondary | ICD-10-CM

## 2022-08-04 DIAGNOSIS — D500 Iron deficiency anemia secondary to blood loss (chronic): Secondary | ICD-10-CM | POA: Diagnosis not present

## 2022-08-04 MED ORDER — SODIUM CHLORIDE 0.9 % IV SOLN
300.0000 mg | Freq: Once | INTRAVENOUS | Status: AC
  Administered 2022-08-04: 300 mg via INTRAVENOUS
  Filled 2022-08-04: qty 300

## 2022-08-04 MED ORDER — SODIUM CHLORIDE 0.9 % IV SOLN: Freq: Once | INTRAVENOUS | Status: AC

## 2022-08-04 NOTE — Patient Instructions (Signed)

## 2022-08-11 ENCOUNTER — Inpatient Hospital Stay: Payer: BC Managed Care – PPO | Attending: Hematology and Oncology

## 2022-08-11 VITALS — BP 124/84 | HR 66 | Temp 98.4°F | Resp 16

## 2022-08-11 DIAGNOSIS — D649 Anemia, unspecified: Secondary | ICD-10-CM

## 2022-08-11 DIAGNOSIS — K529 Noninfective gastroenteritis and colitis, unspecified: Secondary | ICD-10-CM | POA: Diagnosis present

## 2022-08-11 DIAGNOSIS — D500 Iron deficiency anemia secondary to blood loss (chronic): Secondary | ICD-10-CM | POA: Insufficient documentation

## 2022-08-11 MED ORDER — SODIUM CHLORIDE 0.9 % IV SOLN
300.0000 mg | Freq: Once | INTRAVENOUS | Status: AC
  Administered 2022-08-11: 300 mg via INTRAVENOUS
  Filled 2022-08-11: qty 300

## 2022-08-11 MED ORDER — SODIUM CHLORIDE 0.9 % IV SOLN: Freq: Once | INTRAVENOUS | Status: AC

## 2022-08-11 NOTE — Progress Notes (Signed)
Pt. declines to stay for 30 minute post observation. Vital signs stable, left via ambulation, no respiratory distress noted. 

## 2022-08-11 NOTE — Patient Instructions (Signed)

## 2022-08-17 MED FILL — Iron Sucrose Inj 20 MG/ML (Fe Equiv): INTRAVENOUS | Qty: 15 | Status: AC

## 2022-08-18 ENCOUNTER — Inpatient Hospital Stay: Payer: BC Managed Care – PPO

## 2022-08-18 VITALS — BP 110/61 | HR 88 | Temp 98.1°F | Resp 16

## 2022-08-18 DIAGNOSIS — D649 Anemia, unspecified: Secondary | ICD-10-CM

## 2022-08-18 DIAGNOSIS — D500 Iron deficiency anemia secondary to blood loss (chronic): Secondary | ICD-10-CM | POA: Diagnosis not present

## 2022-08-18 MED ORDER — SODIUM CHLORIDE 0.9 % IV SOLN
300.0000 mg | Freq: Once | INTRAVENOUS | Status: AC
  Administered 2022-08-18: 300 mg via INTRAVENOUS
  Filled 2022-08-18: qty 300

## 2022-08-18 MED ORDER — SODIUM CHLORIDE 0.9 % IV SOLN: Freq: Once | INTRAVENOUS | Status: AC

## 2022-08-18 NOTE — Patient Instructions (Signed)

## 2022-11-04 NOTE — Progress Notes (Signed)
HEMATOLOGY-ONCOLOGY TELEPHONE VISIT PROGRESS NOTE  I connected with our patient on 11/12/22 at  3:15 PM EDT by telephone and verified that I am speaking with the correct person using two identifiers.  I discussed the limitations, risks, security and privacy concerns of performing an evaluation and management service by telephone and the availability of in person appointments.  I also discussed with the patient that there may be a patient responsible charge related to this service. The patient expressed understanding and agreed to proceed.   History of Present Illness: Follow-up to discuss the results of iron deficiency anemia.  Patient received IV iron in February and felt reasonably well.  She had some complications after receiving Venofer.  REVIEW OF SYSTEMS:   Constitutional: Denies fevers, chills or abnormal weight loss All other systems were reviewed with the patient and are negative. Observations/Objective:     Assessment Plan:  Symptomatic anemia Lab review: 04/05/2021: Hemoglobin 5.8, MCV 55.5, RDW 20.5, ferritin 1.4, iron saturation 2%, tissue transglutaminase antibody: Negative 04/19/2021: Hemoglobin 8.7 02/21/2022: Ferritin 3.3, iron saturation 5%, TIBC 421, hemoglobin 11.6, MCV 78.9, platelets 362 07/20/2022: Hemoglobin 10.4, MCV 78.3, platelets 436, RDW 16.3, ferritin 2, iron saturation 13%  11/08/2022: Hemoglobin 11.1, MCV 80.5, iron saturation 4%, ferritin 6    Most likely cause: 2017: Ulcerative colitis causing intermittent bleeding versus malabsorption Intolerance to oral iron therapy.  It causes diarrhea Blood transfusions 2017 and 2022 04/27/2019: Colonoscopy: Inactive chronic colitis negative for dysplasia   Treatment: IV iron Venofer: October 2023, February 2024  Recommendation: IV iron therapy Oral iron does not work well for her with adverse effects. We will request for IV iron Monoferric because of  intolerance to Venofer. If it does not get approved then we may  have to consider applying for drug assistance.   I discussed the assessment and treatment plan with the patient. The patient was provided an opportunity to ask questions and all were answered. The patient agreed with the plan and demonstrated an understanding of the instructions. The patient was advised to call back or seek an in-person evaluation if the symptoms worsen or if the condition fails to improve as anticipated.   I provided 12 minutes of non-face-to-face time during this encounter.  This includes time for charting and coordination of care   Tamsen Meek, MD  I Janan Ridge am acting as a scribe for Dr.Blaine Guiffre  I have reviewed the above documentation for accuracy and completeness, and I agree with the above.

## 2022-11-08 ENCOUNTER — Inpatient Hospital Stay: Payer: BC Managed Care – PPO | Attending: Hematology and Oncology

## 2022-11-08 ENCOUNTER — Other Ambulatory Visit: Payer: Self-pay

## 2022-11-08 DIAGNOSIS — K529 Noninfective gastroenteritis and colitis, unspecified: Secondary | ICD-10-CM | POA: Insufficient documentation

## 2022-11-08 DIAGNOSIS — D649 Anemia, unspecified: Secondary | ICD-10-CM

## 2022-11-08 DIAGNOSIS — D500 Iron deficiency anemia secondary to blood loss (chronic): Secondary | ICD-10-CM | POA: Insufficient documentation

## 2022-11-08 LAB — CBC WITH DIFFERENTIAL (CANCER CENTER ONLY)
Abs Immature Granulocytes: 0.02 10*3/uL (ref 0.00–0.07)
Basophils Absolute: 0 10*3/uL (ref 0.0–0.1)
Basophils Relative: 1 %
Eosinophils Absolute: 0.2 10*3/uL (ref 0.0–0.5)
Eosinophils Relative: 3 %
HCT: 35.1 % — ABNORMAL LOW (ref 36.0–46.0)
Hemoglobin: 11.1 g/dL — ABNORMAL LOW (ref 12.0–15.0)
Immature Granulocytes: 0 %
Lymphocytes Relative: 17 %
Lymphs Abs: 1.1 10*3/uL (ref 0.7–4.0)
MCH: 25.5 pg — ABNORMAL LOW (ref 26.0–34.0)
MCHC: 31.6 g/dL (ref 30.0–36.0)
MCV: 80.5 fL (ref 80.0–100.0)
Monocytes Absolute: 0.6 10*3/uL (ref 0.1–1.0)
Monocytes Relative: 10 %
Neutro Abs: 4.6 10*3/uL (ref 1.7–7.7)
Neutrophils Relative %: 69 %
Platelet Count: 440 10*3/uL — ABNORMAL HIGH (ref 150–400)
RBC: 4.36 MIL/uL (ref 3.87–5.11)
RDW: 14.6 % (ref 11.5–15.5)
WBC Count: 6.5 10*3/uL (ref 4.0–10.5)
nRBC: 0 % (ref 0.0–0.2)

## 2022-11-08 LAB — IRON AND IRON BINDING CAPACITY (CC-WL,HP ONLY)
Iron: 17 ug/dL — ABNORMAL LOW (ref 28–170)
Saturation Ratios: 4 % — ABNORMAL LOW (ref 10.4–31.8)
TIBC: 399 ug/dL (ref 250–450)
UIBC: 382 ug/dL (ref 148–442)

## 2022-11-08 NOTE — Progress Notes (Signed)
Vit D level added at Pt request.

## 2022-11-09 LAB — FERRITIN: Ferritin: 6 ng/mL — ABNORMAL LOW (ref 11–307)

## 2022-11-10 LAB — MISC LABCORP TEST (SEND OUT): Labcorp test code: 81950

## 2022-11-12 ENCOUNTER — Inpatient Hospital Stay: Payer: BC Managed Care – PPO | Attending: Hematology and Oncology | Admitting: Hematology and Oncology

## 2022-11-12 DIAGNOSIS — D649 Anemia, unspecified: Secondary | ICD-10-CM | POA: Diagnosis not present

## 2022-11-12 NOTE — Assessment & Plan Note (Signed)
Lab review: 04/05/2021: Hemoglobin 5.8, MCV 55.5, RDW 20.5, ferritin 1.4, iron saturation 2%, tissue transglutaminase antibody: Negative 04/19/2021: Hemoglobin 8.7 02/21/2022: Ferritin 3.3, iron saturation 5%, TIBC 421, hemoglobin 11.6, MCV 78.9, platelets 362 07/20/2022: Hemoglobin 10.4, MCV 78.3, platelets 436, RDW 16.3, ferritin 2, iron saturation 13%  11/08/2022: Hemoglobin 11.1, MCV 80.5, iron saturation 4%, ferritin 6    Most likely cause: 2017: Ulcerative colitis causing intermittent bleeding versus malabsorption Intolerance to oral iron therapy.  It causes diarrhea Blood transfusions 2017 and 2022 04/27/2019: Colonoscopy: Inactive chronic colitis negative for dysplasia   Treatment: IV iron Venofer: October 2023, February 2024  Recommendation: 3 more doses of IV iron therapy

## 2022-12-03 ENCOUNTER — Telehealth: Payer: Self-pay | Admitting: Hematology and Oncology

## 2022-12-03 NOTE — Telephone Encounter (Signed)
Called to scheduled appointment per staff message. Left voicemail.

## 2022-12-06 ENCOUNTER — Telehealth: Payer: Self-pay | Admitting: Hematology and Oncology

## 2022-12-06 NOTE — Telephone Encounter (Signed)
Scheduled appointment per staff message. Patient is aware of the made appointment. 

## 2022-12-19 ENCOUNTER — Telehealth: Payer: Self-pay | Admitting: Hematology and Oncology

## 2022-12-19 NOTE — Telephone Encounter (Signed)
Called to reschedule patients appointment for 7/13 iron infusion. Moneferric can not be given on Saturday. Will FU later this afternoon.

## 2022-12-19 NOTE — Telephone Encounter (Signed)
Rescheduled appointment per staff message. Patient is aware of the changes made to her upcoming appointment. 

## 2022-12-22 ENCOUNTER — Inpatient Hospital Stay: Payer: BC Managed Care – PPO

## 2022-12-24 ENCOUNTER — Inpatient Hospital Stay: Payer: BC Managed Care – PPO | Admitting: Physician Assistant

## 2022-12-24 ENCOUNTER — Other Ambulatory Visit: Payer: Self-pay

## 2022-12-24 ENCOUNTER — Inpatient Hospital Stay: Payer: BC Managed Care – PPO | Attending: Hematology and Oncology

## 2022-12-24 ENCOUNTER — Other Ambulatory Visit: Payer: Self-pay | Admitting: Hematology and Oncology

## 2022-12-24 VITALS — BP 150/96 | HR 80 | Temp 98.5°F | Resp 18

## 2022-12-24 DIAGNOSIS — D500 Iron deficiency anemia secondary to blood loss (chronic): Secondary | ICD-10-CM | POA: Insufficient documentation

## 2022-12-24 DIAGNOSIS — K529 Noninfective gastroenteritis and colitis, unspecified: Secondary | ICD-10-CM | POA: Diagnosis present

## 2022-12-24 DIAGNOSIS — T8090XA Unspecified complication following infusion and therapeutic injection, initial encounter: Secondary | ICD-10-CM | POA: Diagnosis not present

## 2022-12-24 DIAGNOSIS — D649 Anemia, unspecified: Secondary | ICD-10-CM

## 2022-12-24 MED ORDER — METHYLPREDNISOLONE SODIUM SUCC 125 MG IJ SOLR
125.0000 mg | Freq: Once | INTRAMUSCULAR | Status: AC | PRN
  Administered 2022-12-24: 62.5 mg via INTRAVENOUS

## 2022-12-24 MED ORDER — SODIUM CHLORIDE 0.9 % IV SOLN: Freq: Once | INTRAVENOUS | Status: AC

## 2022-12-24 MED ORDER — FAMOTIDINE IN NACL 20-0.9 MG/50ML-% IV SOLN
20.0000 mg | Freq: Once | INTRAVENOUS | Status: AC | PRN
  Administered 2022-12-24: 20 mg via INTRAVENOUS

## 2022-12-24 MED ORDER — SODIUM CHLORIDE 0.9 % IV SOLN: Freq: Once | INTRAVENOUS | Status: DC | PRN

## 2022-12-24 MED ORDER — SODIUM CHLORIDE 0.9 % IV SOLN
1000.0000 mg | Freq: Once | INTRAVENOUS | Status: AC
  Administered 2022-12-24: 1000 mg via INTRAVENOUS
  Filled 2022-12-24: qty 10

## 2022-12-24 NOTE — Progress Notes (Signed)
Hypersensitivity Reaction Note  Date of event: 12/24/22  Time of event: 1557   Type of event: Grade 2 (Moderate reaction; Requires therapy or infusion interruption, but responds promptly to interventions; prophylactic medications indicated for <24 hours)    Generic name of drug involved: Other (Monoferric)   Initial Presentation and Response:  The patient reported and showed signs of diaphoresis, flushing, shortness of breath , and hypertension during the infusion.   Provider notified of the hypersensitivity reaction: Daphane Shepherd, PA-C  Time of provider notification: [phone]  Initial Interventions Implemented:   Infusion stopped: Yes  Medications administered - see MAR for sequence and times of administration: Normal Saline 1 L IV, Famotidine (Pepcid) 20 mg IV, and Methylprednisolone (Solu-Medrol) 62.5 mg IV  Additional interventions:  2 L oxygen administered  Patient response to treatment: Symptoms resolved following interventions.  Remaining Course of Treatment:    The patient was unable to complete the infusion.  Was agent that likely caused hypersensitivity reaction added to Allergies List within EMR? Yes   Additional Information Regarding the Chain of Events (including reaction signs/symptoms, treatment administered, and outcome):  At 1556 Pt c/o feeling warm and stated "my heart is racing". This RN stopped infusion and administered 1 L NS. Upon further assessment Pt was found to be short of breath, diaphoretic and flushed. Karie Fetch PA-C notified. At 1559 Karie Fetch PA-C chairside assessing Pt. Pt found to be hypertensive. Rescue meds administered as ordered by Anice Paganini, see [facility] for details. 1615 Pt stated "I feel back to normal", upon visual assessment Pt no longer appeared to be flushed or diaphoretic. Dr. Pamelia Hoit made aware. Per Karie Fetch PA-C and Dr. Pamelia Hoit Monoferric infusion discontinued and Pt kept for observation. At 1650 VSS, Pt back to baseline and P discharged. Pt  educated to report to ED if any SOB, chest pain, or trouble breathing persists at home. See MAR and VSS flowsheets respectively.

## 2022-12-24 NOTE — Progress Notes (Signed)
    DATE:  12/24/22  X  INFUSION REACTION     MD: Pamelia Hoit   AGENT/BLOOD PRODUCT RECEIVING TODAY:              Monoferric   AGENT/BLOOD PRODUCT RECEIVING IMMEDIATELY PRIOR TO REACTION:          Monoferric   VS: BP:     175/111   P:       86       SPO2:       100% RA                BP:     141/94   P:       77       SPO2:       100% RA     REACTION(S):           shortness of breath, tachycardia, diaphoresis, facial flushing   PREMEDS:     none   INTERVENTION: Pepcid 20 mg Iv, solu-medrol 62.5 mg IV, 1L IVF   Review of Systems  Review of Systems  Constitutional:  Positive for diaphoresis.  Respiratory:  Positive for shortness of breath.   Skin:  Positive for color change.  All other systems reviewed and are negative.    Physical Exam  Physical Exam Vitals and nursing note reviewed.  Constitutional:      Appearance: She is diaphoretic.  HENT:     Head: Normocephalic.  Eyes:     Conjunctiva/sclera: Conjunctivae normal.  Cardiovascular:     Rate and Rhythm: Normal rate and regular rhythm.     Pulses: Normal pulses.     Heart sounds: Normal heart sounds.  Pulmonary:     Effort: Pulmonary effort is normal.     Breath sounds: Normal breath sounds.  Musculoskeletal:     Cervical back: Normal range of motion.  Skin:    General: Skin is warm.     Comments: Facial flushing  Neurological:     Mental Status: She is alert.     OUTCOME:         Patient with severe symptoms 10 cc into infusion. Emergency medications given as documented above. Patient initially hypertensive, BP improved on recheck. Patient returned to baseline. Based on severity of symptoms patient will not be re challenged. Dr Pamelia Hoit notified and is recommending to switch to [facility] if possible. RN will work on the change. Patient discharged ins stable condition.   I have spent a total of 10 minutes minutes of face-to-face and non-face-to-face time preparing to see the patient, performing a medically  appropriate examination, counseling and educating the patient, ordering medications, documenting clinical information in the electronic health record, and care coordination.

## 2022-12-24 NOTE — Progress Notes (Signed)
Patient had a serious reaction to Monoferric.  She tolerated previously Venofer.  Therefore we will proceed with 3 doses of Venofer

## 2023-01-01 ENCOUNTER — Telehealth: Payer: Self-pay | Admitting: Hematology and Oncology

## 2023-01-01 NOTE — Telephone Encounter (Signed)
Scheduled appointments per staff message. Patient is aware of the made appointments.

## 2023-01-12 ENCOUNTER — Inpatient Hospital Stay: Payer: BC Managed Care – PPO | Attending: Hematology and Oncology

## 2023-01-12 ENCOUNTER — Other Ambulatory Visit: Payer: Self-pay

## 2023-01-12 VITALS — BP 125/88 | HR 64 | Temp 98.6°F | Resp 16

## 2023-01-12 DIAGNOSIS — D500 Iron deficiency anemia secondary to blood loss (chronic): Secondary | ICD-10-CM | POA: Insufficient documentation

## 2023-01-12 DIAGNOSIS — K529 Noninfective gastroenteritis and colitis, unspecified: Secondary | ICD-10-CM | POA: Diagnosis present

## 2023-01-12 DIAGNOSIS — D649 Anemia, unspecified: Secondary | ICD-10-CM

## 2023-01-12 MED ORDER — FAMOTIDINE IN NACL 20-0.9 MG/50ML-% IV SOLN
20.0000 mg | Freq: Once | INTRAVENOUS | Status: AC | PRN
  Administered 2023-01-12: 20 mg via INTRAVENOUS

## 2023-01-12 MED ORDER — METHYLPREDNISOLONE SODIUM SUCC 125 MG IJ SOLR: 125.0000 mg | Freq: Once | INTRAMUSCULAR | Status: DC | PRN

## 2023-01-12 MED ORDER — SODIUM CHLORIDE 0.9 % IV SOLN: Freq: Once | INTRAVENOUS | Status: DC | PRN

## 2023-01-12 MED ORDER — SODIUM CHLORIDE 0.9 % IV SOLN: Freq: Once | INTRAVENOUS | Status: AC

## 2023-01-12 MED ORDER — SODIUM CHLORIDE 0.9 % IV SOLN
300.0000 mg | Freq: Once | INTRAVENOUS | Status: AC
  Administered 2023-01-12: 300 mg via INTRAVENOUS
  Filled 2023-01-12: qty 300

## 2023-01-12 NOTE — Progress Notes (Signed)
Hypersensitivity Reaction note  Date of event: 01/12/23 Time of event: 0926 Generic name of drug involved: Venofer Name of provider notified of the hypersensitivity reaction: Cherie Ouch, NP Was agent that likely caused hypersensitivity reaction added to Allergies List within EMR? Yes Chain of events including reaction signs/symptoms, treatment administered, and outcome (e.g., drug resumed; drug discontinued; sent to Emergency Department; etc.)  [phone]: Patient stated "I think I'm having a reaction my heart is beating fast" Venofer stopped. BP 151/93  Temp 98.2  HR 72 O2 100%  0928:  1 L NS administered 20 mg Pepcid administered  0932: Patient reports relief of symptoms. Patient declines re challenge of venofer.  BP 134/88  O2 100%  HR  64  RR 16 0957: Patient reports relief of symptoms. Patient educated to call with any concerns. Patient verbalized understanding. Patient ambulatory with no complaints at discharge.  BP  125/88   HR 64  O2 100%  Temp 98.2  RR: [address] RN 01/12/2023 10:02 AM

## 2023-01-12 NOTE — Patient Instructions (Signed)

## 2023-01-14 ENCOUNTER — Telehealth: Payer: Self-pay

## 2023-01-14 NOTE — Telephone Encounter (Signed)
This nurse called patient to follow up on venofer infusion reaction. Patient states she hasn't had any problems and feels normal. Patient states that she does not want to try venofer again. Patient educated to call with any additional concerns. Patient verbalized understanding.

## 2023-01-19 ENCOUNTER — Inpatient Hospital Stay: Payer: BC Managed Care – PPO

## 2023-01-26 ENCOUNTER — Inpatient Hospital Stay: Payer: BC Managed Care – PPO

## 2023-01-30 ENCOUNTER — Telehealth: Payer: Self-pay

## 2023-01-30 NOTE — Telephone Encounter (Signed)
Pt called and would like to discuss Venofer reaction and next steps. Advised pt we would need to make appt with Dr Axel Filler, NP to further discuss recommendation for Feraheme. She was given appt for 8/26 at 0945 telephone.

## 2023-02-01 ENCOUNTER — Other Ambulatory Visit: Payer: BC Managed Care – PPO

## 2023-02-04 ENCOUNTER — Inpatient Hospital Stay (HOSPITAL_BASED_OUTPATIENT_CLINIC_OR_DEPARTMENT_OTHER): Payer: BC Managed Care – PPO | Admitting: Adult Health

## 2023-02-04 DIAGNOSIS — D649 Anemia, unspecified: Secondary | ICD-10-CM | POA: Diagnosis not present

## 2023-02-04 NOTE — Assessment & Plan Note (Signed)
Most likely cause: 2017: Ulcerative colitis causing intermittent bleeding versus malabsorption Intolerance to oral iron therapy.  It causes diarrhea   Treatment: IV iron Venofer: October 2023, February 2024 Recommendation: She has had hypersensitive reactions to both Monoferric and Venofer.  I reviewed this with Dr. Pamelia Hoit who wants her to receive Feraheme.  These orders were placed.  I have placed these orders with premedications.  She verbalized understanding and agreement to proceed.  She will return in 10 to 12 weeks for labs and follow-up with Dr. Pamelia Hoit.

## 2023-02-04 NOTE — Progress Notes (Signed)
[facility] Cancer Follow up:    Frederich Chick., MD [address] [place] [address]  I connected with Leyanna Motherway on 02/04/23 at  3:45 PM EDT by telephone and verified that I am speaking with the correct person using two identifiers.  I discussed the limitations, risks, security and privacy concerns of performing an evaluation and management service by telephone and the availability of in person appointments.  I also discussed with the patient that there may be a patient responsible charge related to this service. The patient expressed understanding and agreed to proceed. Patient location: home Provider location: [facility] office  DIAGNOSIS: iron deficiency anemia  SUMMARY OF HEMATOLOGIC HISTORY: Lab review: 04/05/2021: Hemoglobin 5.8, MCV 55.5, RDW 20.5, ferritin 1.4, iron saturation 2%, tissue transglutaminase antibody: Negative 04/19/2021: Hemoglobin 8.7 02/21/2022: Ferritin 3.3, iron saturation 5%, TIBC 421, hemoglobin 11.6, MCV 78.9, platelets 362 07/20/2022: Hemoglobin 10.4, MCV 78.3, platelets 436, RDW 16.3, ferritin 2, iron saturation 13%  11/08/2022: Hemoglobin 11.1, MCV 80.5, iron saturation 4%, ferritin 6    Most likely cause: 2017: Ulcerative colitis causing intermittent bleeding versus malabsorption Intolerance to oral iron therapy.  It causes diarrhea Blood transfusions 2017 and 2022 04/27/2019: Colonoscopy: Inactive chronic colitis negative for dysplasia Previous reaction to IV iron: Allergic reaction to Monoferric and Venofer  CURRENT THERAPY: Feraheme  INTERVAL HISTORY: Deaira Conkey 32 y.o. female returns to discuss the potential of IV Feraheme for future IV iron infusions.  She notes that she is nervous about this because of her previous reactions but also notes she is unable to tolerate oral iron.  She continues to follow Dr. Bosie Clos for her ulcerative colitis.   Patient Active Problem List   Diagnosis Date Noted   Symptomatic anemia 05/12/2018    Ulcerative colitis (HCC) 05/12/2018   Diarrhea 05/12/2018   Hypokalemia 05/12/2018    is allergic to monoferric [ferric derisomaltose] and venofer [iron sucrose].  MEDICAL HISTORY: Past Medical History:  Diagnosis Date   Ulcerative colitis [facility])     SURGICAL HISTORY: Past Surgical History:  Procedure Laterality Date   BIOPSY  05/14/2018   Procedure: BIOPSY;  Surgeon: Carman Ching, MD;  Location: [facility] ENDOSCOPY;  Service: Endoscopy;;   FLEXIBLE SIGMOIDOSCOPY N/A 05/14/2018   Procedure: Arnell Sieving;  Surgeon: Carman Ching, MD;  Location: [facility] ENDOSCOPY;  Service: Endoscopy;  Laterality: N/A;    SOCIAL HISTORY: Social History   Socioeconomic History   Marital status: Single    Spouse name: Not on file   Number of children: Not on file   Years of education: Not on file   Highest education level: Not on file  Occupational History   Not on file  Tobacco Use   Smoking status: Never   Smokeless tobacco: Never  Substance and Sexual Activity   Alcohol use: Never   Drug use: Never   Sexual activity: Not Currently  Other Topics Concern   Not on file  Social History Narrative   Not on file   Social Determinants of Health   Financial Resource Strain: Not on file  Food Insecurity: Not on file  Transportation Needs: Not on file  Physical Activity: Not on file  Stress: Not on file  Social Connections: Not on file  Intimate Partner Violence: Not on file    FAMILY HISTORY: Non-contributory  Review of Systems  Constitutional:  Negative for appetite change, chills, fatigue, fever and unexpected weight change.  HENT:   Negative for hearing loss, lump/mass and trouble swallowing.  Eyes:  Negative for eye problems and icterus.  Respiratory:  Negative for chest tightness, cough and shortness of breath.   Cardiovascular:  Negative for chest pain, leg swelling and palpitations.  Gastrointestinal:  Negative for abdominal distention, abdominal pain, constipation,  diarrhea, nausea and vomiting.  Endocrine: Negative for hot flashes.  Genitourinary:  Negative for difficulty urinating.   Musculoskeletal:  Negative for arthralgias.  Skin:  Negative for itching and rash.  Neurological:  Negative for dizziness, extremity weakness, headaches and numbness.  Hematological:  Negative for adenopathy. Does not bruise/bleed easily.  Psychiatric/Behavioral:  Negative for depression. The patient is not nervous/anxious.       PHYSICAL EXAMINATION  Patient sounds well she is in no apparent distress mood and behavior are normal speech is normal.   ASSESSMENT and THERAPY PLAN:   Symptomatic anemia Most likely cause: 2017: Ulcerative colitis causing intermittent bleeding versus malabsorption Intolerance to oral iron therapy.  It causes diarrhea   Treatment: IV iron Venofer: October 2023, February 2024 Recommendation: She has had hypersensitive reactions to both Monoferric and Venofer.  I reviewed this with Dr. Pamelia Hoit who wants her to receive Feraheme.  These orders were placed.  I have placed these orders with premedications.  She verbalized understanding and agreement to proceed.  She will return in 10 to 12 weeks for labs and follow-up with Dr. Pamelia Hoit.  Follow up instructions:    -Return to cancer center for IV iron, f/u with Dr. Pamelia Hoit in 10-12 weeks   The patient was provided an opportunity to ask questions and all were answered. The patient agreed with the plan and demonstrated an understanding of the instructions.   The patient was advised to call back or seek an in-person evaluation if the symptoms worsen or if the condition fails to improve as anticipated.   I provided 15 minutes of non face-to-face telephone visit time during this encounter, and > 50% was spent counseling as documented under my assessment & plan.  Lillard Anes, NP 02/04/23 4:21 PM Medical Oncology and Hematology [facility] [address] [place], [place]  [address] Tel. [phone]    Fax. [phone]  *Total Encounter Time as defined by the Centers for Medicare and Medicaid Services includes, in addition to the face-to-face time of a patient visit (documented in the note above) non-face-to-face time: obtaining and reviewing outside history, ordering and reviewing medications, tests or procedures, care coordination (communications with other health care professionals or caregivers) and documentation in the medical record.

## 2023-02-04 NOTE — Progress Notes (Signed)
[facility] Cancer Follow up:    Frederich Chick., MD [address] [place] [address]  I connected with Joann Robinson on 02/04/23 at  9:45 AM EDT by telephone and verified that I am speaking with the correct person using two identifiers.  I discussed the limitations, risks, security and privacy concerns of performing an evaluation and management service by telephone and the availability of in person appointments.  I also discussed with the patient that there may be a patient responsible charge related to this service. The patient expressed understanding and agreed to proceed.    DIAGNOSIS:   SUMMARY OF ONCOLOGIC HISTORY:   CURRENT THERAPY:  INTERVAL HISTORY: Joann Robinson 32 y.o. female returns for    Patient Active Problem List   Diagnosis Date Noted   Symptomatic anemia 05/12/2018   Colitis 05/12/2018   Diarrhea 05/12/2018   Hypokalemia 05/12/2018    is allergic to monoferric [ferric derisomaltose] and venofer [iron sucrose].  MEDICAL HISTORY: Past Medical History:  Diagnosis Date   Ulcerative colitis [facility])     SURGICAL HISTORY: Past Surgical History:  Procedure Laterality Date   BIOPSY  05/14/2018   Procedure: BIOPSY;  Surgeon: Carman Ching, MD;  Location: [facility] ENDOSCOPY;  Service: Endoscopy;;   FLEXIBLE SIGMOIDOSCOPY N/A 05/14/2018   Procedure: Arnell Sieving;  Surgeon: Carman Ching, MD;  Location: [facility] ENDOSCOPY;  Service: Endoscopy;  Laterality: N/A;    SOCIAL HISTORY: Social History   Socioeconomic History   Marital status: Single    Spouse name: Not on file   Number of children: Not on file   Years of education: Not on file   Highest education level: Not on file  Occupational History   Not on file  Tobacco Use   Smoking status: Never   Smokeless tobacco: Never  Substance and Sexual Activity   Alcohol use: Never   Drug use: Never   Sexual activity: Not Currently  Other Topics Concern   Not on file  Social History  Narrative   Not on file   Social Determinants of Health   Financial Resource Strain: Not on file  Food Insecurity: Not on file  Transportation Needs: Not on file  Physical Activity: Not on file  Stress: Not on file  Social Connections: Not on file  Intimate Partner Violence: Not on file    FAMILY HISTORY: No family history on file.  Review of Systems - Oncology    PHYSICAL EXAMINATION  Patient sounds well.  She is in no apparent distress.  Mood and behavior are normal.  LABORATORY DATA:  CBC    Component Value Date/Time   WBC 6.5 11/08/2022 1519   WBC 19.1 (H) 05/28/2022 2048   RBC 4.36 11/08/2022 1519   HGB 11.1 (L) 11/08/2022 1519   HCT 35.1 (L) 11/08/2022 1519   PLT 440 (H) 11/08/2022 1519   MCV 80.5 11/08/2022 1519   MCH 25.5 (L) 11/08/2022 1519   MCHC 31.6 11/08/2022 1519   RDW 14.6 11/08/2022 1519   LYMPHSABS 1.1 11/08/2022 1519   MONOABS 0.6 11/08/2022 1519   EOSABS 0.2 11/08/2022 1519   BASOSABS 0.0 11/08/2022 1519    CMP     Component Value Date/Time   NA 135 05/28/2022 2048   K 3.6 05/28/2022 2048   CL 100 05/28/2022 2048   CO2 20 (L) 05/28/2022 2048   GLUCOSE 124 (H) 05/28/2022 2048   BUN 16 05/28/2022 2048   CREATININE 1.03 (H) 05/28/2022 2048   CALCIUM 9.2  05/28/2022 2048   PROT 8.8 (H) 05/28/2022 2048   ALBUMIN 3.7 05/28/2022 2048   AST 15 05/28/2022 2048   ALT 9 05/28/2022 2048   ALKPHOS 55 05/28/2022 2048   BILITOT 0.3 05/28/2022 2048   GFRNONAA >60 05/28/2022 2048   GFRAA >60 05/19/2018 0549       ASSESSMENT and THERAPY PLAN:   No problem-specific Assessment & Plan notes found for this encounter. Follow up instructions:    -Return to cancer center ***  -Mammogram due in *** -Follow up with surgery ***   The patient was provided an opportunity to ask questions and all were answered. The patient agreed with the plan and demonstrated an understanding of the instructions.   The patient was advised to call back or seek an  in-person evaluation if the symptoms worsen or if the condition fails to improve as anticipated.   I provided *** minutes of {Blank single:19197::"face-to-face video visit time","non face-to-face telephone visit time"} during this encounter, and > 50% was spent counseling as documented under my assessment & plan.  Lillard Anes, NP 02/04/23 10:12 AM Medical Oncology and Hematology [facility] [address] [place], [place] [address] Tel. [phone]    Fax. [phone]

## 2023-02-05 ENCOUNTER — Telehealth: Payer: BC Managed Care – PPO | Admitting: Hematology and Oncology

## 2023-02-08 ENCOUNTER — Telehealth: Payer: Self-pay | Admitting: Adult Health

## 2023-02-08 NOTE — Telephone Encounter (Signed)
Scheduled appointments per los. Patient is aware of the made appointments. 

## 2023-02-14 ENCOUNTER — Encounter: Payer: Self-pay | Admitting: Adult Health

## 2023-02-20 ENCOUNTER — Inpatient Hospital Stay: Payer: BC Managed Care – PPO | Attending: Hematology and Oncology

## 2023-02-20 VITALS — BP 130/84 | HR 71 | Temp 99.3°F | Resp 16

## 2023-02-20 DIAGNOSIS — D500 Iron deficiency anemia secondary to blood loss (chronic): Secondary | ICD-10-CM | POA: Diagnosis present

## 2023-02-20 DIAGNOSIS — K529 Noninfective gastroenteritis and colitis, unspecified: Secondary | ICD-10-CM | POA: Insufficient documentation

## 2023-02-20 DIAGNOSIS — D649 Anemia, unspecified: Secondary | ICD-10-CM

## 2023-02-20 MED ORDER — CETIRIZINE HCL 10 MG/ML IV SOLN
10.0000 mg | Freq: Once | INTRAVENOUS | Status: AC
  Administered 2023-02-20: 10 mg via INTRAVENOUS
  Filled 2023-02-20: qty 1

## 2023-02-20 MED ORDER — METHYLPREDNISOLONE SODIUM SUCC 125 MG IJ SOLR
125.0000 mg | Freq: Once | INTRAMUSCULAR | Status: AC
  Administered 2023-02-20: 125 mg via INTRAVENOUS
  Filled 2023-02-20: qty 2

## 2023-02-20 MED ORDER — SODIUM CHLORIDE 0.9 % IV SOLN
510.0000 mg | Freq: Once | INTRAVENOUS | Status: AC
  Administered 2023-02-20: 510 mg via INTRAVENOUS
  Filled 2023-02-20: qty 510

## 2023-02-20 MED ORDER — FAMOTIDINE IN NACL 20-0.9 MG/50ML-% IV SOLN
20.0000 mg | Freq: Once | INTRAVENOUS | Status: AC
  Administered 2023-02-20: 20 mg via INTRAVENOUS
  Filled 2023-02-20: qty 50

## 2023-02-20 MED ORDER — ACETAMINOPHEN 325 MG PO TABS
650.0000 mg | ORAL_TABLET | Freq: Once | ORAL | Status: AC
  Administered 2023-02-20: 650 mg via ORAL
  Filled 2023-02-20: qty 2

## 2023-02-20 MED ORDER — SODIUM CHLORIDE 0.9 % IV SOLN: Freq: Once | INTRAVENOUS | Status: AC

## 2023-02-20 NOTE — Patient Instructions (Signed)
 Ferumoxytol Injection What is this medication? FERUMOXYTOL (FER ue MOX i tol) treats low levels of iron in your body (iron deficiency anemia). Iron is a mineral that plays an important role in making red blood cells, which carry oxygen from your lungs to the rest of your body. This medicine may be used for other purposes; ask your health care provider or pharmacist if you have questions. COMMON BRAND NAME(S): Feraheme What should I tell my care team before I take this medication? They need to know if you have any of these conditions: Anemia not caused by low iron levels High levels of iron in the blood Magnetic resonance imaging (MRI) test scheduled An unusual or allergic reaction to iron, other medications, foods, dyes, or preservatives Pregnant or trying to get pregnant Breastfeeding How should I use this medication? This medication is injected into a vein. It is given by your care team in a hospital or clinic setting. Talk to your care team the use of this medication in children. Special care may be needed. Overdosage: If you think you have taken too much of this medicine contact a poison control center or emergency room at once. NOTE: This medicine is only for you. Do not share this medicine with others. What if I miss a dose? It is important not to miss your dose. Call your care team if you are unable to keep an appointment. What may interact with this medication? Other iron products This list may not describe all possible interactions. Give your health care provider a list of all the medicines, herbs, non-prescription drugs, or dietary supplements you use. Also tell them if you smoke, drink alcohol, or use illegal drugs. Some items may interact with your medicine. What should I watch for while using this medication? Visit your care team regularly. Tell your care team if your symptoms do not start to get better or if they get worse. You may need blood work done while you are taking this  medication. You may need to follow a special diet. Talk to your care team. Foods that contain iron include: whole grains/cereals, dried fruits, beans, or peas, leafy green vegetables, and organ meats (liver, kidney). What side effects may I notice from receiving this medication? Side effects that you should report to your care team as soon as possible: Allergic reactions--skin rash, itching, hives, swelling of the face, lips, tongue, or throat Low blood pressure--dizziness, feeling faint or lightheaded, blurry vision Shortness of breath Side effects that usually do not require medical attention (report to your care team if they continue or are bothersome): Flushing Headache Joint pain Muscle pain Nausea Pain, redness, or irritation at injection site This list may not describe all possible side effects. Call your doctor for medical advice about side effects. You may report side effects to FDA at 1-800-FDA-1088. Where should I keep my medication? This medication is given in a hospital or clinic. It will not be stored at home. NOTE: This sheet is a summary. It may not cover all possible information. If you have questions about this medicine, talk to your doctor, pharmacist, or health care provider.  2024 Elsevier/Gold Standard (2022-11-02 00:00:00)

## 2023-02-27 ENCOUNTER — Inpatient Hospital Stay: Payer: BC Managed Care – PPO

## 2023-02-27 VITALS — BP 123/87 | HR 65 | Temp 98.9°F | Resp 17

## 2023-02-27 DIAGNOSIS — D500 Iron deficiency anemia secondary to blood loss (chronic): Secondary | ICD-10-CM | POA: Diagnosis not present

## 2023-02-27 DIAGNOSIS — D649 Anemia, unspecified: Secondary | ICD-10-CM

## 2023-02-27 MED ORDER — SODIUM CHLORIDE 0.9 % IV SOLN
510.0000 mg | Freq: Once | INTRAVENOUS | Status: AC
  Administered 2023-02-27: 510 mg via INTRAVENOUS
  Filled 2023-02-27: qty 510

## 2023-02-27 MED ORDER — SODIUM CHLORIDE 0.9 % IV SOLN: Freq: Once | INTRAVENOUS | Status: AC

## 2023-02-27 MED ORDER — ACETAMINOPHEN 325 MG PO TABS
650.0000 mg | ORAL_TABLET | Freq: Once | ORAL | Status: AC
  Administered 2023-02-27: 650 mg via ORAL
  Filled 2023-02-27: qty 2

## 2023-02-27 MED ORDER — FAMOTIDINE IN NACL 20-0.9 MG/50ML-% IV SOLN
20.0000 mg | Freq: Once | INTRAVENOUS | Status: AC
  Administered 2023-02-27: 20 mg via INTRAVENOUS
  Filled 2023-02-27: qty 50

## 2023-02-27 MED ORDER — CETIRIZINE HCL 10 MG/ML IV SOLN
10.0000 mg | Freq: Once | INTRAVENOUS | Status: AC
  Administered 2023-02-27: 10 mg via INTRAVENOUS
  Filled 2023-02-27: qty 1

## 2023-02-27 MED ORDER — METHYLPREDNISOLONE SODIUM SUCC 125 MG IJ SOLR
125.0000 mg | Freq: Once | INTRAMUSCULAR | Status: AC
  Administered 2023-02-27: 125 mg via INTRAVENOUS
  Filled 2023-02-27: qty 2

## 2023-02-27 NOTE — Progress Notes (Signed)
Pt declined to stay for full 30 min post obs, observed for 20 min, discharged with VSS, ambulatory to lobby

## 2023-02-27 NOTE — Patient Instructions (Signed)
Ferumoxytol Injection What is this medication? FERUMOXYTOL (FER ue MOX i tol) treats low levels of iron in your body (iron deficiency anemia). Iron is a mineral that plays an important role in making red blood cells, which carry oxygen from your lungs to the rest of your body. This medicine may be used for other purposes; ask your health care provider or pharmacist if you have questions. COMMON BRAND NAME(S): Feraheme What should I tell my care team before I take this medication? They need to know if you have any of these conditions: Anemia not caused by low iron levels High levels of iron in the blood Magnetic resonance imaging (MRI) test scheduled An unusual or allergic reaction to iron, other medications, foods, dyes, or preservatives Pregnant or trying to get pregnant Breastfeeding How should I use this medication? This medication is injected into a vein. It is given by your care team in a hospital or clinic setting. Talk to your care team the use of this medication in children. Special care may be needed. Overdosage: If you think you have taken too much of this medicine contact a poison control center or emergency room at once. NOTE: This medicine is only for you. Do not share this medicine with others. What if I miss a dose? It is important not to miss your dose. Call your care team if you are unable to keep an appointment. What may interact with this medication? Other iron products This list may not describe all possible interactions. Give your health care provider a list of all the medicines, herbs, non-prescription drugs, or dietary supplements you use. Also tell them if you smoke, drink alcohol, or use illegal drugs. Some items may interact with your medicine. What should I watch for while using this medication? Visit your care team regularly. Tell your care team if your symptoms do not start to get better or if they get worse. You may need blood work done while you are taking this  medication. You may need to follow a special diet. Talk to your care team. Foods that contain iron include: whole grains/cereals, dried fruits, beans, or peas, leafy green vegetables, and organ meats (liver, kidney). What side effects may I notice from receiving this medication? Side effects that you should report to your care team as soon as possible: Allergic reactions--skin rash, itching, hives, swelling of the face, lips, tongue, or throat Low blood pressure--dizziness, feeling faint or lightheaded, blurry vision Shortness of breath Side effects that usually do not require medical attention (report to your care team if they continue or are bothersome): Flushing Headache Joint pain Muscle pain Nausea Pain, redness, or irritation at injection site This list may not describe all possible side effects. Call your doctor for medical advice about side effects. You may report side effects to FDA at 1-800-FDA-1088. Where should I keep my medication? This medication is given in a hospital or clinic. It will not be stored at home. NOTE: This sheet is a summary. It may not cover all possible information. If you have questions about this medicine, talk to your doctor, pharmacist, or health care provider.  2024 Elsevier/Gold Standard (2022-11-02 00:00:00)

## 2023-04-29 ENCOUNTER — Inpatient Hospital Stay: Payer: BC Managed Care – PPO

## 2023-04-29 ENCOUNTER — Other Ambulatory Visit: Payer: Self-pay

## 2023-04-29 ENCOUNTER — Inpatient Hospital Stay: Payer: BC Managed Care – PPO | Attending: Hematology and Oncology | Admitting: Hematology and Oncology

## 2023-04-29 DIAGNOSIS — D500 Iron deficiency anemia secondary to blood loss (chronic): Secondary | ICD-10-CM | POA: Insufficient documentation

## 2023-04-29 DIAGNOSIS — D649 Anemia, unspecified: Secondary | ICD-10-CM

## 2023-04-29 DIAGNOSIS — K529 Noninfective gastroenteritis and colitis, unspecified: Secondary | ICD-10-CM | POA: Insufficient documentation

## 2023-04-29 NOTE — Assessment & Plan Note (Signed)
Lab review: 04/05/2021: Hemoglobin 5.8, MCV 55.5, RDW 20.5, ferritin 1.4, iron saturation 2%, tissue transglutaminase antibody: Negative 04/19/2021: Hemoglobin 8.7 02/21/2022: Ferritin 3.3, iron saturation 5%, TIBC 421, hemoglobin 11.6, MCV 78.9, platelets 362 07/20/2022: Hemoglobin 10.4, MCV 78.3, platelets 436, RDW 16.3, ferritin 2, iron saturation 13%  11/08/2022: Hemoglobin 11.1, MCV 80.5, iron saturation 4%, ferritin 6     Most likely cause: 2017: Ulcerative colitis causing intermittent bleeding versus malabsorption Intolerance to oral iron therapy.  It causes diarrhea Blood transfusions 2017 and 2022 04/27/2019: Colonoscopy: Inactive chronic colitis negative for dysplasia   Treatment: IV iron Venofer: October 2023, February 2024, 02/20/2023 Serious reaction to Monoferric.  Feraheme was better tolerated.  Patient needs labs and then telephone follow-up after that

## 2023-04-30 ENCOUNTER — Other Ambulatory Visit: Payer: Self-pay

## 2023-04-30 ENCOUNTER — Inpatient Hospital Stay: Payer: BC Managed Care – PPO

## 2023-04-30 DIAGNOSIS — D649 Anemia, unspecified: Secondary | ICD-10-CM

## 2023-04-30 DIAGNOSIS — D500 Iron deficiency anemia secondary to blood loss (chronic): Secondary | ICD-10-CM | POA: Diagnosis present

## 2023-04-30 DIAGNOSIS — K529 Noninfective gastroenteritis and colitis, unspecified: Secondary | ICD-10-CM | POA: Diagnosis present

## 2023-04-30 LAB — CBC WITH DIFFERENTIAL (CANCER CENTER ONLY)
Abs Immature Granulocytes: 0.02 10*3/uL (ref 0.00–0.07)
Basophils Absolute: 0.1 10*3/uL (ref 0.0–0.1)
Basophils Relative: 1 %
Eosinophils Absolute: 0.2 10*3/uL (ref 0.0–0.5)
Eosinophils Relative: 2 %
HCT: 40.4 % (ref 36.0–46.0)
Hemoglobin: 12.7 g/dL (ref 12.0–15.0)
Immature Granulocytes: 0 %
Lymphocytes Relative: 13 %
Lymphs Abs: 1.2 10*3/uL (ref 0.7–4.0)
MCH: 26.3 pg (ref 26.0–34.0)
MCHC: 31.4 g/dL (ref 30.0–36.0)
MCV: 83.6 fL (ref 80.0–100.0)
Monocytes Absolute: 0.9 10*3/uL (ref 0.1–1.0)
Monocytes Relative: 10 %
Neutro Abs: 7.1 10*3/uL (ref 1.7–7.7)
Neutrophils Relative %: 74 %
Platelet Count: 413 10*3/uL — ABNORMAL HIGH (ref 150–400)
RBC: 4.83 MIL/uL (ref 3.87–5.11)
RDW: 16.5 % — ABNORMAL HIGH (ref 11.5–15.5)
WBC Count: 9.4 10*3/uL (ref 4.0–10.5)
nRBC: 0 % (ref 0.0–0.2)

## 2023-05-01 LAB — IRON AND IRON BINDING CAPACITY (CC-WL,HP ONLY)
Iron: 25 ug/dL — ABNORMAL LOW (ref 28–170)
Saturation Ratios: 7 % — ABNORMAL LOW (ref 10.4–31.8)
TIBC: 357 ug/dL (ref 250–450)
UIBC: 332 ug/dL (ref 148–442)

## 2023-05-01 LAB — FERRITIN: Ferritin: 16 ng/mL (ref 11–307)

## 2023-05-02 ENCOUNTER — Inpatient Hospital Stay (HOSPITAL_BASED_OUTPATIENT_CLINIC_OR_DEPARTMENT_OTHER): Payer: BC Managed Care – PPO | Admitting: Hematology and Oncology

## 2023-05-02 DIAGNOSIS — D649 Anemia, unspecified: Secondary | ICD-10-CM | POA: Diagnosis not present

## 2023-05-02 MED ORDER — FERROUS GLUCONATE 324 (38 FE) MG PO TABS: 324.0000 mg | ORAL_TABLET | Freq: Every day | ORAL | 3 refills | Status: DC

## 2023-05-02 NOTE — Assessment & Plan Note (Signed)
Lab review: 04/05/2021: Hemoglobin 5.8, MCV 55.5, RDW 20.5, ferritin 1.4, iron saturation 2%, tissue transglutaminase antibody: Negative 04/19/2021: Hemoglobin 8.7 02/21/2022: Ferritin 3.3, iron saturation 5%, TIBC 421, hemoglobin 11.6, MCV 78.9, platelets 362 07/20/2022: Hemoglobin 10.4, MCV 78.3, platelets 436, RDW 16.3, ferritin 2, iron saturation 13%  11/08/2022: Hemoglobin 11.1, MCV 80.5, iron saturation 4%, ferritin 6 04/30/2023: Hemoglobin 12.7, iron saturation 7%, ferritin 16     Most likely cause: 2017: Ulcerative colitis causing intermittent bleeding versus malabsorption Intolerance to oral iron therapy.  It causes diarrhea Blood transfusions 2017 and 2022 04/27/2019: Colonoscopy: Inactive chronic colitis negative for dysplasia   Treatment: IV iron Venofer: October 2023, February 2024, September 2024 (Feraheme, reaction to Monoferric)  Oral iron does not work well for her with adverse effects  Continue to watch and monitor with rechecking labs in 3 months and telephone visit 2 days later

## 2023-05-02 NOTE — Progress Notes (Signed)
HEMATOLOGY-ONCOLOGY TELEPHONE VISIT PROGRESS NOTE  I connected with our patient on 05/02/23 at 11:45 AM EST by telephone and verified that I am speaking with the correct person using two identifiers.  I discussed the limitations, risks, security and privacy concerns of performing an evaluation and management service by telephone and the availability of in person appointments.  I also discussed with the patient that there may be a patient responsible charge related to this service. The patient expressed understanding and agreed to proceed.   History of Present Illness:  Discussed the use of AI scribe software for clinical note transcription with the patient, who gave verbal consent to proceed.  History of Present Illness   The patient, with a history of iron deficiency anemia, presents for follow-up after an iron infusion in September. She reports feeling better since the infusion, with a noticeable improvement in her energy levels. She has a history of heavy menstrual cycles, which are suspected to contribute to her anemia. She has previously tried oral iron supplementation (ferrous sulfate), but experienced diarrhea as a side effect and had to discontinue the medication.        REVIEW OF SYSTEMS:   Constitutional: Denies fevers, chills or abnormal weight loss All other systems were reviewed with the patient and are negative.   Assessment Plan:  Symptomatic anemia Lab review: 04/05/2021: Hemoglobin 5.8, MCV 55.5, RDW 20.5, ferritin 1.4, iron saturation 2%, tissue transglutaminase antibody: Negative 04/19/2021: Hemoglobin 8.7 02/21/2022: Ferritin 3.3, iron saturation 5%, TIBC 421, hemoglobin 11.6, MCV 78.9, platelets 362 07/20/2022: Hemoglobin 10.4, MCV 78.3, platelets 436, RDW 16.3, ferritin 2, iron saturation 13%  11/08/2022: Hemoglobin 11.1, MCV 80.5, iron saturation 4%, ferritin 6 04/30/2023: Hemoglobin 12.7, iron saturation 7%, ferritin 16     Most likely cause: 2017: Ulcerative colitis  causing intermittent bleeding versus malabsorption Intolerance to oral iron therapy.  It causes diarrhea Blood transfusions 2017 and 2022 04/27/2019: Colonoscopy: Inactive chronic colitis negative for dysplasia   Treatment: IV iron Venofer: October 2023, February 2024, September 2024 (Feraheme, reaction to Monoferric)  Oral iron does not work well for her with adverse effects.  The patient is willing to try a different brand of oral iron.       Iron Deficiency Anemia Improved symptoms and hemoglobin levels post iron infusion in September. Ferritin and iron saturation still low. Unable to tolerate oral iron supplementation due to diarrhea. Heavy menstrual cycles contributing to iron deficiency. -Trial of ferrous gluconate (Fergon) to see if it is better tolerated. -Plan for lab recheck in two months to monitor iron levels. -Consideration for future iron infusions if unable to maintain levels with oral supplementation.     2 months labs and follow-up 2 days later to discuss results  I discussed the assessment and treatment plan with the patient. The patient was provided an opportunity to ask questions and all were answered. The patient agreed with the plan and demonstrated an understanding of the instructions. The patient was advised to call back or seek an in-person evaluation if the symptoms worsen or if the condition fails to improve as anticipated.   I provided 12 minutes of non-face-to-face time during this encounter.  This includes time for charting and coordination of care   Tamsen Meek, MD

## 2023-05-03 ENCOUNTER — Telehealth: Payer: Self-pay | Admitting: Hematology and Oncology

## 2023-05-03 NOTE — Telephone Encounter (Signed)
Patient is aware of scheduled appointment times/dates

## 2023-06-25 ENCOUNTER — Encounter: Payer: Self-pay | Admitting: Adult Health

## 2023-07-02 ENCOUNTER — Other Ambulatory Visit: Payer: BC Managed Care – PPO

## 2023-07-04 ENCOUNTER — Inpatient Hospital Stay: Payer: BLUE CROSS/BLUE SHIELD | Attending: Hematology and Oncology

## 2023-07-05 ENCOUNTER — Telehealth: Payer: BC Managed Care – PPO | Admitting: Hematology and Oncology

## 2023-07-08 ENCOUNTER — Telehealth: Payer: Self-pay | Admitting: Hematology and Oncology

## 2023-07-08 NOTE — Telephone Encounter (Signed)
Patient called and stated unable to make appt, due to labs not completed. Appointments rescheduled.

## 2023-07-09 ENCOUNTER — Telehealth: Payer: BC Managed Care – PPO | Admitting: Hematology and Oncology

## 2023-07-15 ENCOUNTER — Inpatient Hospital Stay: Payer: BLUE CROSS/BLUE SHIELD | Attending: Hematology and Oncology

## 2023-07-15 DIAGNOSIS — D649 Anemia, unspecified: Secondary | ICD-10-CM

## 2023-07-15 DIAGNOSIS — K529 Noninfective gastroenteritis and colitis, unspecified: Secondary | ICD-10-CM | POA: Diagnosis present

## 2023-07-15 DIAGNOSIS — D500 Iron deficiency anemia secondary to blood loss (chronic): Secondary | ICD-10-CM | POA: Insufficient documentation

## 2023-07-15 LAB — CBC WITH DIFFERENTIAL (CANCER CENTER ONLY)
Abs Immature Granulocytes: 0.02 10*3/uL (ref 0.00–0.07)
Basophils Absolute: 0.1 10*3/uL (ref 0.0–0.1)
Basophils Relative: 1 %
Eosinophils Absolute: 0.3 10*3/uL (ref 0.0–0.5)
Eosinophils Relative: 3 %
HCT: 38.2 % (ref 36.0–46.0)
Hemoglobin: 12.1 g/dL (ref 12.0–15.0)
Immature Granulocytes: 0 %
Lymphocytes Relative: 14 %
Lymphs Abs: 1.4 10*3/uL (ref 0.7–4.0)
MCH: 26.5 pg (ref 26.0–34.0)
MCHC: 31.7 g/dL (ref 30.0–36.0)
MCV: 83.6 fL (ref 80.0–100.0)
Monocytes Absolute: 1.1 10*3/uL — ABNORMAL HIGH (ref 0.1–1.0)
Monocytes Relative: 11 %
Neutro Abs: 6.9 10*3/uL (ref 1.7–7.7)
Neutrophils Relative %: 71 %
Platelet Count: 451 10*3/uL — ABNORMAL HIGH (ref 150–400)
RBC: 4.57 MIL/uL (ref 3.87–5.11)
RDW: 13 % (ref 11.5–15.5)
WBC Count: 9.7 10*3/uL (ref 4.0–10.5)
nRBC: 0 % (ref 0.0–0.2)

## 2023-07-15 LAB — IRON AND IRON BINDING CAPACITY (CC-WL,HP ONLY)
Iron: 91 ug/dL (ref 28–170)
Saturation Ratios: 24 % (ref 10.4–31.8)
TIBC: 378 ug/dL (ref 250–450)
UIBC: 287 ug/dL

## 2023-07-16 LAB — FERRITIN: Ferritin: 12 ng/mL (ref 11–307)

## 2023-07-22 NOTE — Assessment & Plan Note (Deleted)
 Lab review: 04/05/2021: Hemoglobin 5.8, MCV 55.5, RDW 20.5, ferritin 1.4, iron saturation 2%, tissue transglutaminase antibody: Negative 04/19/2021: Hemoglobin 8.7 02/21/2022: Ferritin 3.3, iron saturation 5%, TIBC 421, hemoglobin 11.6, MCV 78.9, platelets 362 07/20/2022: Hemoglobin 10.4, MCV 78.3, platelets 436, RDW 16.3, ferritin 2, iron saturation 13%  11/08/2022: Hemoglobin 11.1, MCV 80.5, iron saturation 4%, ferritin 6 04/30/2023: Hemoglobin 12.7, iron saturation 7%, ferritin 16 07/15/2023: Hemoglobin 12.1, MCV 83.6, iron saturation 24%, ferritin 12     Most likely cause: 2017: Ulcerative colitis causing intermittent bleeding versus malabsorption Intolerance to oral iron therapy.  It causes diarrhea Blood transfusions 2017 and 2022 04/27/2019: Colonoscopy: Inactive chronic colitis negative for dysplasia   Treatment: IV iron Venofer: October 2023, February 2024, September 2024 (Feraheme, reaction to Monoferric) Patient does not need IV iron at this time.  Recheck labs in 3 months and telephone visit follow-up

## 2023-07-23 ENCOUNTER — Telehealth: Payer: BLUE CROSS/BLUE SHIELD | Admitting: Hematology and Oncology

## 2023-07-23 ENCOUNTER — Ambulatory Visit: Payer: BLUE CROSS/BLUE SHIELD | Admitting: Hematology and Oncology

## 2023-07-23 ENCOUNTER — Encounter: Payer: Self-pay | Admitting: Adult Health

## 2023-07-23 DIAGNOSIS — D649 Anemia, unspecified: Secondary | ICD-10-CM

## 2023-07-29 ENCOUNTER — Inpatient Hospital Stay (HOSPITAL_BASED_OUTPATIENT_CLINIC_OR_DEPARTMENT_OTHER): Payer: BLUE CROSS/BLUE SHIELD | Admitting: Hematology and Oncology

## 2023-07-29 DIAGNOSIS — D649 Anemia, unspecified: Secondary | ICD-10-CM | POA: Diagnosis not present

## 2023-07-29 NOTE — Progress Notes (Signed)
HEMATOLOGY-ONCOLOGY TELEPHONE VISIT PROGRESS NOTE  I connected with our patient on 07/29/23 at  8:00 AM EST by telephone and verified that I am speaking with the correct person using two identifiers.  I discussed the limitations, risks, security and privacy concerns of performing an evaluation and management service by telephone and the availability of in person appointments.  I also discussed with the patient that there may be a patient responsible charge related to this service. The patient expressed understanding and agreed to proceed.   History of Present Illness:  Discussed the use of AI scribe software for clinical note transcription with the patient, who gave verbal consent to proceed.  History of Present Illness   Joann Robinson is a 33 year old female with anemia who presents for follow-up.  She has no symptoms of anemia such as fatigue, shortness of breath, or dizziness. Her hemoglobin levels have been stable, with a recent measurement of 12.1 g/dL, and previously 96.0 g/dL in November.  She last received iron infusion in September 2024, and her current iron saturation is at 24%. Her ferritin level is 12 ng/mL, indicating her iron stores are not optimal but acceptable. She has not required additional iron supplementation since September.          REVIEW OF SYSTEMS:   Constitutional: Denies fevers, chills or abnormal weight loss All other systems were reviewed with the patient and are negative. Observations/Objective:     Assessment Plan:  Symptomatic anemia Lab review: 04/05/2021: Hemoglobin 5.8, MCV 55.5, RDW 20.5, ferritin 1.4, iron saturation 2%, tissue transglutaminase antibody: Negative 04/19/2021: Hemoglobin 8.7 02/21/2022: Ferritin 3.3, iron saturation 5%, TIBC 421, hemoglobin 11.6, MCV 78.9, platelets 362 07/20/2022: Hemoglobin 10.4, MCV 78.3, platelets 436, RDW 16.3, ferritin 2, iron saturation 13%  11/08/2022: Hemoglobin 11.1, MCV 80.5, iron saturation 4%, ferritin  6 04/30/2023: Hemoglobin 12.7, iron saturation 7%, ferritin 16 07/15/2023: Hemoglobin 12.1, MCV 83.6, iron saturation 24%, ferritin 12  Most likely cause: 2017: Ulcerative colitis causing intermittent bleeding versus malabsorption Intolerance to oral iron therapy.  It causes diarrhea Blood transfusions 2017 and 2022 04/27/2019: Colonoscopy: Inactive chronic colitis negative for dysplasia   Treatment: IV iron Venofer: October 2023, February 2024, September 2024 (Feraheme, reaction to Monoferric)  At this point there is no need for IV iron therapy. Recheck labs and follow-up in 6 months with a telephone visit.    I discussed the assessment and treatment plan with the patient. The patient was provided an opportunity to ask questions and all were answered. The patient agreed with the plan and demonstrated an understanding of the instructions. The patient was advised to call back or seek an in-person evaluation if the symptoms worsen or if the condition fails to improve as anticipated.   I provided 20 minutes of non-face-to-face time during this encounter.  This includes time for charting and coordination of care   Tamsen Meek, MD

## 2023-07-29 NOTE — Assessment & Plan Note (Signed)
Lab review: 04/05/2021: Hemoglobin 5.8, MCV 55.5, RDW 20.5, ferritin 1.4, iron saturation 2%, tissue transglutaminase antibody: Negative 04/19/2021: Hemoglobin 8.7 02/21/2022: Ferritin 3.3, iron saturation 5%, TIBC 421, hemoglobin 11.6, MCV 78.9, platelets 362 07/20/2022: Hemoglobin 10.4, MCV 78.3, platelets 436, RDW 16.3, ferritin 2, iron saturation 13%  11/08/2022: Hemoglobin 11.1, MCV 80.5, iron saturation 4%, ferritin 6 04/30/2023: Hemoglobin 12.7, iron saturation 7%, ferritin 16 07/15/2023: Hemoglobin 12.1, MCV 83.6, iron saturation 24%, ferritin 12  Most likely cause: 2017: Ulcerative colitis causing intermittent bleeding versus malabsorption Intolerance to oral iron therapy.  It causes diarrhea Blood transfusions 2017 and 2022 04/27/2019: Colonoscopy: Inactive chronic colitis negative for dysplasia   Treatment: IV iron Venofer: October 2023, February 2024, September 2024 (Feraheme, reaction to Monoferric)  At this point there is no need for IV iron therapy. Recheck labs and follow-up in 3 months with a telephone visit.

## 2023-09-23 ENCOUNTER — Other Ambulatory Visit: Payer: Self-pay | Admitting: *Deleted

## 2023-09-23 MED ORDER — FERROUS GLUCONATE 324 (38 FE) MG PO TABS
324.0000 mg | ORAL_TABLET | Freq: Every day | ORAL | 3 refills | Status: DC
Start: 2023-09-23 — End: 2024-04-23

## 2024-04-22 ENCOUNTER — Other Ambulatory Visit: Payer: Self-pay | Admitting: Hematology and Oncology
